# Patient Record
Sex: Male | Born: 1965 | Race: White | Hispanic: No | Marital: Single | Smoking: Never smoker
Health system: Southern US, Community
[De-identification: ages and names within clinical notes are randomized; demographics above are authoritative.]

## PROBLEM LIST (undated history)

## (undated) DIAGNOSIS — K219 Gastro-esophageal reflux disease without esophagitis: Secondary | ICD-10-CM

## (undated) DIAGNOSIS — I10 Essential (primary) hypertension: Secondary | ICD-10-CM

## (undated) DIAGNOSIS — M1990 Unspecified osteoarthritis, unspecified site: Secondary | ICD-10-CM

## (undated) DIAGNOSIS — S299XXA Unspecified injury of thorax, initial encounter: Secondary | ICD-10-CM

## (undated) DIAGNOSIS — N529 Male erectile dysfunction, unspecified: Secondary | ICD-10-CM

## (undated) DIAGNOSIS — T7840XA Allergy, unspecified, initial encounter: Secondary | ICD-10-CM

## (undated) DIAGNOSIS — G40909 Epilepsy, unspecified, not intractable, without status epilepticus: Secondary | ICD-10-CM

## (undated) HISTORY — DX: Gastro-esophageal reflux disease without esophagitis: K21.9

## (undated) HISTORY — DX: Epilepsy, unspecified, not intractable, without status epilepticus: G40.909

## (undated) HISTORY — DX: Unspecified osteoarthritis, unspecified site: M19.90

## (undated) HISTORY — DX: Unspecified injury of thorax, initial encounter: S29.9XXA

## (undated) HISTORY — DX: Essential (primary) hypertension: I10

## (undated) HISTORY — DX: Male erectile dysfunction, unspecified: N52.9

## (undated) HISTORY — DX: Allergy, unspecified, initial encounter: T78.40XA

---
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
## 2007-09-16 ENCOUNTER — Ambulatory Visit (HOSPITAL_COMMUNITY): Admission: RE | Admit: 2007-09-16 | Discharge: 2007-09-16 | Payer: Self-pay | Admitting: Chiropractic Medicine

## 2014-01-22 LAB — PSA

## 2015-02-11 ENCOUNTER — Telehealth: Payer: Self-pay

## 2015-02-11 MED ORDER — TADALAFIL 20 MG PO TABS: 20.0000 mg | ORAL_TABLET | Freq: Every day | ORAL | Status: DC | PRN

## 2015-02-11 MED ORDER — PHENOBARBITAL 97.2 MG PO TABS: 97.2000 mg | ORAL_TABLET | Freq: Every day | ORAL | Status: DC

## 2015-02-11 NOTE — Telephone Encounter (Signed)
Patient was last seen on 08/12/14, practice partner number is [phone] and [facility] [address] in [place].

## 2015-02-11 NOTE — Telephone Encounter (Signed)
Needs seen further refills 

## 2015-02-14 ENCOUNTER — Telehealth: Payer: Self-pay

## 2015-02-14 NOTE — Telephone Encounter (Signed)
Got phone number from practice partner. Called and let patient know that prescriptions were refilled, one was sent to pharmacy and the other needs to be picked up here at the front desk and taken to the pharmacy.

## 2015-02-16 NOTE — Telephone Encounter (Signed)
Telephone number has been disconnected. Unable to contact patient. Thanks.

## 2015-03-30 ENCOUNTER — Other Ambulatory Visit: Payer: Self-pay

## 2015-03-30 NOTE — Telephone Encounter (Signed)
Looked in patient's chart and noticed that Malachy Mood stated this patient needed an appointment for further refills. So I called and scheduled the patient an appointment for 04/01/15. [facility] [address] in [place].

## 2015-03-31 DIAGNOSIS — G40909 Epilepsy, unspecified, not intractable, without status epilepticus: Secondary | ICD-10-CM | POA: Insufficient documentation

## 2015-03-31 DIAGNOSIS — J309 Allergic rhinitis, unspecified: Secondary | ICD-10-CM | POA: Insufficient documentation

## 2015-03-31 DIAGNOSIS — N529 Male erectile dysfunction, unspecified: Secondary | ICD-10-CM | POA: Insufficient documentation

## 2015-04-01 ENCOUNTER — Encounter: Payer: Self-pay | Admitting: Unknown Physician Specialty

## 2015-04-01 ENCOUNTER — Ambulatory Visit (INDEPENDENT_AMBULATORY_CARE_PROVIDER_SITE_OTHER): Payer: BLUE CROSS/BLUE SHIELD | Admitting: Unknown Physician Specialty

## 2015-04-01 VITALS — BP 138/82 | HR 74 | Temp 98.7°F | Ht 68.8 in | Wt 183.6 lb

## 2015-04-01 DIAGNOSIS — Z5181 Encounter for therapeutic drug level monitoring: Secondary | ICD-10-CM | POA: Diagnosis not present

## 2015-04-01 DIAGNOSIS — N529 Male erectile dysfunction, unspecified: Secondary | ICD-10-CM

## 2015-04-01 DIAGNOSIS — G40909 Epilepsy, unspecified, not intractable, without status epilepticus: Secondary | ICD-10-CM

## 2015-04-01 MED ORDER — PHENOBARBITAL 97.2 MG PO TABS: 97.2000 mg | ORAL_TABLET | Freq: Every day | ORAL | Status: DC

## 2015-04-01 MED ORDER — TADALAFIL 20 MG PO TABS: 20.0000 mg | ORAL_TABLET | Freq: Every day | ORAL | Status: DC | PRN

## 2015-04-01 NOTE — Assessment & Plan Note (Signed)
Stable, no seizure activity for over 20 years.

## 2015-04-01 NOTE — Assessment & Plan Note (Signed)
Currently taking tadalafil as needed

## 2015-04-01 NOTE — Progress Notes (Signed)
BP 138/82 mmHg  Pulse 74  Temp(Src) 98.7 F (37.1 C)  Ht 5' 8.8" (1.748 m)  Wt 183 lb 9.6 oz (83.28 kg)  BMI 27.26 kg/m2  SpO2 93%   Subjective:    Patient ID: Dillon Blake, male    DOB: [birth year], 49 y.o.   MRN: [id number]  HPI: Dillon Blake is a 49 y.o. male  Chief Complaint  Patient presents with  . Medication Refill    pt states he needs a refill on cialis and is not completely out of phenobarbital but would like to go ahead and get it refilled because of travel   Seizure disorder: Doing well, no seizures since 1991. Stable on phenobarbitol. Takes medication everyday, no missed doses. Denies nausea, tiredness, irritability.   Erectile Dysfunction: Tadalafil works well. Denies memory issues, diarrhea or cold like symptoms.  Relevant past medical, surgical, family and social history reviewed and updated as indicated. Interim medical history since our last visit reviewed. Allergies and medications reviewed and updated.  Review of Systems  Constitutional: Negative.  Negative for fever, activity change, appetite change and fatigue.  HENT: Negative.  Negative for congestion, postnasal drip and rhinorrhea.   Eyes: Negative.  Negative for discharge and redness.  Respiratory: Negative.  Negative for cough, chest tightness, shortness of breath and wheezing.   Cardiovascular: Negative.  Negative for chest pain, palpitations and leg swelling.  Gastrointestinal: Negative.  Negative for nausea, abdominal pain, diarrhea and constipation.  Musculoskeletal: Negative.  Negative for myalgias, back pain, arthralgias, gait problem and neck pain.  Skin: Negative.  Negative for color change, pallor, rash and wound.  Neurological: Negative.  Negative for dizziness, weakness, light-headedness and headaches.  Psychiatric/Behavioral: Negative.  Negative for behavioral problems, sleep disturbance, self-injury and decreased concentration. The patient is not nervous/anxious.     Per HPI unless  specifically indicated above     Objective:    BP 138/82 mmHg  Pulse 74  Temp(Src) 98.7 F (37.1 C)  Ht 5' 8.8" (1.748 m)  Wt 183 lb 9.6 oz (83.28 kg)  BMI 27.26 kg/m2  SpO2 93%  Wt Readings from Last 3 Encounters:  04/01/15 183 lb 9.6 oz (83.28 kg)  08/12/14 186 lb (84.369 kg)    Physical Exam  Constitutional: He is oriented to person, place, and time. He appears well-developed and well-nourished. No distress.  HENT:  Head: Normocephalic and atraumatic.  Eyes: Conjunctivae are normal. Right eye exhibits no discharge. Left eye exhibits no discharge.  Neck: Normal range of motion.  Cardiovascular: Normal rate, regular rhythm and normal heart sounds.   Pulmonary/Chest: Effort normal and breath sounds normal. No respiratory distress. He has no wheezes. He has no rales. He exhibits no tenderness.  Musculoskeletal: Normal range of motion. He exhibits no edema or tenderness.  Neurological: He is alert and oriented to person, place, and time.  Skin: Skin is warm and dry. No rash noted. He is not diaphoretic. No erythema. No pallor.  Psychiatric: He has a normal mood and affect. His behavior is normal. Judgment and thought content normal.        Assessment & Plan:   Problem List Items Addressed This Visit      Unprioritized   Seizure disorder ([place]) - Primary    Stable, no seizure activity for over 20 years.       Relevant Medications   PHENobarbital (LUMINAL) 97.2 MG tablet   ED (erectile dysfunction)    Currently taking tadalafil as needed  Relevant Medications   tadalafil (CIALIS) 20 MG tablet    Other Visit Diagnoses    Medication monitoring encounter        Relevant Orders    Phenobarbital level    CBC with Differential/Platelet        Follow up plan: Return in about 3 months (around 07/02/2015) for Physical.

## 2015-04-02 LAB — CBC WITH DIFFERENTIAL/PLATELET
BASOS ABS: 0 10*3/uL (ref 0.0–0.2)
Basos: 1 %
EOS (ABSOLUTE): 0.1 10*3/uL (ref 0.0–0.4)
Eos: 1 %
Hematocrit: 47.2 % (ref 37.5–51.0)
Hemoglobin: 16.4 g/dL (ref 12.6–17.7)
IMMATURE GRANS (ABS): 0 10*3/uL (ref 0.0–0.1)
IMMATURE GRANULOCYTES: 0 %
LYMPHS: 37 %
Lymphocytes Absolute: 2.3 10*3/uL (ref 0.7–3.1)
MCH: 32 pg (ref 26.6–33.0)
MCHC: 34.7 g/dL (ref 31.5–35.7)
MCV: 92 fL (ref 79–97)
MONOS ABS: 0.5 10*3/uL (ref 0.1–0.9)
Monocytes: 8 %
NEUTROS PCT: 53 %
Neutrophils Absolute: 3.3 10*3/uL (ref 1.4–7.0)
PLATELETS: 227 10*3/uL (ref 150–379)
RBC: 5.12 x10E6/uL (ref 4.14–5.80)
RDW: 13.2 % (ref 12.3–15.4)
WBC: 6.2 10*3/uL (ref 3.4–10.8)

## 2015-04-02 LAB — PHENOBARBITAL LEVEL: PHENOBARBITAL, SERUM: 15 ug/mL (ref 15–40)

## 2015-04-04 ENCOUNTER — Encounter: Payer: Self-pay | Admitting: Unknown Physician Specialty

## 2015-04-04 NOTE — Progress Notes (Signed)
Quick Note:  Normal labs. Patient notified by letter. ______ 

## 2015-06-08 ENCOUNTER — Other Ambulatory Visit: Payer: Self-pay | Admitting: Unknown Physician Specialty

## 2015-06-08 DIAGNOSIS — N529 Male erectile dysfunction, unspecified: Secondary | ICD-10-CM

## 2015-06-08 NOTE — Telephone Encounter (Signed)
Patient has appointment scheduled for 07/06/15.

## 2015-06-08 NOTE — Telephone Encounter (Signed)
Pt called stated he needs a refill on Cialis. [facility] [facility]. Thanks.

## 2015-06-09 MED ORDER — TADALAFIL 20 MG PO TABS: 20.0000 mg | ORAL_TABLET | Freq: Every day | ORAL | Status: DC | PRN

## 2015-07-06 ENCOUNTER — Encounter: Payer: BLUE CROSS/BLUE SHIELD | Admitting: Unknown Physician Specialty

## 2015-07-19 ENCOUNTER — Encounter: Payer: Self-pay | Admitting: Unknown Physician Specialty

## 2015-08-09 ENCOUNTER — Other Ambulatory Visit: Payer: Self-pay | Admitting: Unknown Physician Specialty

## 2015-09-01 ENCOUNTER — Other Ambulatory Visit: Payer: Self-pay | Admitting: Unknown Physician Specialty

## 2015-09-01 DIAGNOSIS — G40909 Epilepsy, unspecified, not intractable, without status epilepticus: Secondary | ICD-10-CM

## 2015-09-01 NOTE — Telephone Encounter (Signed)
Pt called a refill on Luminal. Please call pt when medication is ready for pick up. Pt would like to pick this up tomorrow as he lives out of town now. Thanks.

## 2015-09-02 MED ORDER — PHENOBARBITAL 97.2 MG PO TABS: 97.2000 mg | ORAL_TABLET | Freq: Every day | ORAL | Status: DC

## 2015-09-02 NOTE — Telephone Encounter (Signed)
Routing to provider  

## 2015-09-20 ENCOUNTER — Other Ambulatory Visit: Payer: Self-pay | Admitting: Unknown Physician Specialty

## 2015-09-20 NOTE — Telephone Encounter (Signed)
rx

## 2015-10-31 ENCOUNTER — Other Ambulatory Visit: Payer: Self-pay | Admitting: Unknown Physician Specialty

## 2015-12-05 ENCOUNTER — Other Ambulatory Visit: Payer: Self-pay | Admitting: Unknown Physician Specialty

## 2015-12-23 ENCOUNTER — Other Ambulatory Visit: Payer: Self-pay | Admitting: Unknown Physician Specialty

## 2016-02-22 ENCOUNTER — Other Ambulatory Visit: Payer: Self-pay | Admitting: Unknown Physician Specialty

## 2016-02-28 ENCOUNTER — Other Ambulatory Visit: Payer: Self-pay | Admitting: Unknown Physician Specialty

## 2016-03-01 ENCOUNTER — Other Ambulatory Visit: Payer: Self-pay | Admitting: Unknown Physician Specialty

## 2016-03-05 ENCOUNTER — Other Ambulatory Visit: Payer: Self-pay | Admitting: Unknown Physician Specialty

## 2016-03-12 ENCOUNTER — Ambulatory Visit: Payer: BLUE CROSS/BLUE SHIELD | Admitting: Unknown Physician Specialty

## 2016-05-09 ENCOUNTER — Ambulatory Visit (INDEPENDENT_AMBULATORY_CARE_PROVIDER_SITE_OTHER): Payer: BLUE CROSS/BLUE SHIELD | Admitting: Unknown Physician Specialty

## 2016-05-09 ENCOUNTER — Encounter: Payer: Self-pay | Admitting: Unknown Physician Specialty

## 2016-05-09 VITALS — BP 153/96 | HR 67 | Temp 98.3°F | Ht 70.0 in | Wt 194.2 lb

## 2016-05-09 DIAGNOSIS — Z5181 Encounter for therapeutic drug level monitoring: Secondary | ICD-10-CM | POA: Diagnosis not present

## 2016-05-09 DIAGNOSIS — I10 Essential (primary) hypertension: Secondary | ICD-10-CM | POA: Insufficient documentation

## 2016-05-09 DIAGNOSIS — G40909 Epilepsy, unspecified, not intractable, without status epilepticus: Secondary | ICD-10-CM | POA: Diagnosis not present

## 2016-05-09 DIAGNOSIS — N529 Male erectile dysfunction, unspecified: Secondary | ICD-10-CM

## 2016-05-09 MED ORDER — PHENOBARBITAL 97.2 MG PO TABS: 97.2000 mg | ORAL_TABLET | Freq: Every day | ORAL | 5 refills | Status: DC

## 2016-05-09 MED ORDER — TADALAFIL 20 MG PO TABS: 20.0000 mg | ORAL_TABLET | Freq: Every day | ORAL | 12 refills | Status: DC | PRN

## 2016-05-09 NOTE — Assessment & Plan Note (Signed)
Refill current medication

## 2016-05-09 NOTE — Patient Instructions (Addendum)
DASH Eating Plan DASH stands for "Dietary Approaches to Stop Hypertension." The DASH eating plan is a healthy eating plan that has been shown to reduce high blood pressure (hypertension). Additional health benefits may include reducing the risk of type 2 diabetes mellitus, heart disease, and stroke. The DASH eating plan may also help with weight loss. What do I need to know about the DASH eating plan? For the DASH eating plan, you will follow these general guidelines:  Choose foods with less than 150 milligrams of sodium per serving (as listed on the food label).  Use salt-free seasonings or herbs instead of table salt or sea salt.  Check with your health care provider or pharmacist before using salt substitutes.  Eat lower-sodium products. These are often labeled as "low-sodium" or "no salt added."  Eat fresh foods. Avoid eating a lot of canned foods.  Eat more vegetables, fruits, and low-fat dairy products.  Choose whole grains. Look for the word "whole" as the first word in the ingredient list.  Choose fish and skinless chicken or turkey more often than red meat. Limit fish, poultry, and meat to 6 oz (170 g) each day.  Limit sweets, desserts, sugars, and sugary drinks.  Choose heart-healthy fats.  Eat more home-cooked food and less restaurant, buffet, and fast food.  Limit fried foods.  Do not fry foods. Cook foods using methods such as baking, boiling, grilling, and broiling instead.  When eating at a restaurant, ask that your food be prepared with less salt, or no salt if possible. What foods can I eat? Seek help from a dietitian for individual calorie needs. Grains  Whole grain or whole wheat bread. Brown rice. Whole grain or whole wheat pasta. Quinoa, bulgur, and whole grain cereals. Low-sodium cereals. Corn or whole wheat flour tortillas. Whole grain cornbread. Whole grain crackers. Low-sodium crackers. Vegetables  Fresh or frozen vegetables (raw, steamed, roasted, or  grilled). Low-sodium or reduced-sodium tomato and vegetable juices. Low-sodium or reduced-sodium tomato sauce and paste. Low-sodium or reduced-sodium canned vegetables. Fruits  All fresh, canned (in natural juice), or frozen fruits. Meat and Other Protein Products  Ground beef (85% or leaner), grass-fed beef, or beef trimmed of fat. Skinless chicken or turkey. Ground chicken or turkey. Pork trimmed of fat. All fish and seafood. Eggs. Dried beans, peas, or lentils. Unsalted nuts and seeds. Unsalted canned beans. Dairy  Low-fat dairy products, such as skim or 1% milk, 2% or reduced-fat cheeses, low-fat ricotta or cottage cheese, or plain low-fat yogurt. Low-sodium or reduced-sodium cheeses. Fats and Oils  Tub margarines without trans fats. Light or reduced-fat mayonnaise and salad dressings (reduced sodium). Avocado. Safflower, olive, or canola oils. Natural peanut or almond butter. Other  Unsalted popcorn and pretzels. The items listed above may not be a complete list of recommended foods or beverages. Contact your dietitian for more options.  What foods are not recommended? Grains  White bread. White pasta. White rice. Refined cornbread. Bagels and croissants. Crackers that contain trans fat. Vegetables  Creamed or fried vegetables. Vegetables in a cheese sauce. Regular canned vegetables. Regular canned tomato sauce and paste. Regular tomato and vegetable juices. Fruits  Canned fruit in light or heavy syrup. Fruit juice. Meat and Other Protein Products  Fatty cuts of meat. Ribs, chicken wings, bacon, sausage, bologna, salami, chitterlings, fatback, hot dogs, bratwurst, and packaged luncheon meats. Salted nuts and seeds. Canned beans with salt. Dairy  Whole or 2% milk, cream, half-and-half, and cream cheese. Whole-fat or sweetened yogurt. Full-fat cheeses   or blue cheese. Nondairy creamers and whipped toppings. Processed cheese, cheese spreads, or cheese curds. Condiments  Onion and garlic  salt, seasoned salt, table salt, and sea salt. Canned and packaged gravies. Worcestershire sauce. Tartar sauce. Barbecue sauce. Teriyaki sauce. Soy sauce, including reduced sodium. Steak sauce. Fish sauce. Oyster sauce. Cocktail sauce. Horseradish. Ketchup and mustard. Meat flavorings and tenderizers. Bouillon cubes. Hot sauce. Tabasco sauce. Marinades. Taco seasonings. Relishes. Fats and Oils  Butter, stick margarine, lard, shortening, ghee, and bacon fat. Coconut, palm kernel, or palm oils. Regular salad dressings. Other  Pickles and olives. Salted popcorn and pretzels. The items listed above may not be a complete list of foods and beverages to avoid. Contact your dietitian for more information.  Where can I find more information? National Heart, Lung, and Blood Institute: www.nhlbi.nih.gov/health/health-topics/topics/dash/ This information is not intended to replace advice given to you by your health care provider. Make sure you discuss any questions you have with your health care provider. Document Released: 05/31/2011 Document Revised: 11/17/2015 Document Reviewed: 04/15/2013 Elsevier Interactive Patient Education  2017 Elsevier Inc.  

## 2016-05-09 NOTE — Progress Notes (Signed)
BP (!) 153/96 (BP Location: Left Arm, Cuff Size: Large)   Pulse 67   Temp 98.3 F (36.8 C)   Ht 5\' 10"  (1.778 m)   Wt 194 lb 3.2 oz (88.1 kg)   SpO2 98%   BMI 27.86 kg/m    Subjective:    Patient ID: Dillon Blake, male    DOB: [birth year], 50 y.o.   MRN: [id number]  HPI: Dillon Blake is a 50 y.o. male  Chief Complaint  Patient presents with  . Seizures  . Erectile Dysfunction  . Medication Refill    pt states he needs both medications refilled   Seizure disorder: Doing well, no seizures since 1991. Stable on phenobarbitol. Takes medication everyday, no missed doses. Denies nausea, tiredness, irritability.  Erectile Dysfunction: Tadalafil works well. Denies memory issues, diarrhea or cold like symptoms.  Hypertension Gained some weight in the past year.  He does not want to start on medication at this time and wants to work on life-style   Relevant past medical, surgical, family and social history reviewed and updated as indicated. Interim medical history since our last visit reviewed. Allergies and medications reviewed and updated.  Review of Systems  Per HPI unless specifically indicated above     Objective:    BP (!) 153/96 (BP Location: Left Arm, Cuff Size: Large)   Pulse 67   Temp 98.3 F (36.8 C)   Ht 5\' 10"  (1.778 m)   Wt 194 lb 3.2 oz (88.1 kg)   SpO2 98%   BMI 27.86 kg/m   Wt Readings from Last 3 Encounters:  05/09/16 194 lb 3.2 oz (88.1 kg)  04/01/15 183 lb 9.6 oz (83.3 kg)  08/12/14 186 lb (84.4 kg)    Physical Exam  Constitutional: He is oriented to person, place, and time. He appears well-developed and well-nourished. No distress.  HENT:  Head: Normocephalic and atraumatic.  Eyes: Conjunctivae and lids are normal. Right eye exhibits no discharge. Left eye exhibits no discharge. No scleral icterus.  Neck: Normal range of motion. Neck supple. No JVD present. Carotid bruit is not present.  Cardiovascular: Normal rate, regular rhythm and  normal heart sounds.   Pulmonary/Chest: Effort normal and breath sounds normal. No respiratory distress.  Abdominal: Normal appearance. There is no splenomegaly or hepatomegaly.  Musculoskeletal: Normal range of motion.  Neurological: He is alert and oriented to person, place, and time.  Skin: Skin is warm, dry and intact. No rash noted. No pallor.  Psychiatric: He has a normal mood and affect. His behavior is normal. Judgment and thought content normal.    Results for orders placed or performed in visit on 04/01/15  Phenobarbital level  Result Value Ref Range   Phenobarbital, Serum 15 15 - 40 ug/mL  CBC with Differential/Platelet  Result Value Ref Range   WBC 6.2 3.4 - 10.8 x10E3/uL   RBC 5.12 4.14 - 5.80 x10E6/uL   Hemoglobin 16.4 12.6 - 17.7 g/dL   Hematocrit 47.2 37.5 - 51.0 %   MCV 92 79 - 97 fL   MCH 32.0 26.6 - 33.0 pg   MCHC 34.7 31.5 - 35.7 g/dL   RDW 13.2 12.3 - 15.4 %   Platelets 227 150 - 379 x10E3/uL   Neutrophils 53 %   Lymphs 37 %   Monocytes 8 %   Eos 1 %   Basos 1 %   Neutrophils Absolute 3.3 1.4 - 7.0 x10E3/uL   Lymphocytes Absolute 2.3 0.7 - 3.1 x10E3/uL   Monocytes Absolute  0.5 0.1 - 0.9 x10E3/uL   EOS (ABSOLUTE) 0.1 0.0 - 0.4 x10E3/uL   Basophils Absolute 0.0 0.0 - 0.2 x10E3/uL   Immature Granulocytes 0 %   Immature Grans (Abs) 0.0 0.0 - 0.1 x10E3/uL      Assessment & Plan:   Problem List Items Addressed This Visit      Unprioritized   ED (erectile dysfunction)    Refill current medication      Hypertension    Discussed high blood pressure.  Pt wants to work on life-style.  Dash diet given      Relevant Medications   tadalafil (CIALIS) 20 MG tablet   Seizure disorder ([place]) - Primary    Refill phenobarb.  Labs for medication monitoring       Other Visit Diagnoses    Medication monitoring encounter       Relevant Orders   Phenobarbital level   CBC with Differential/Platelet   Comprehensive metabolic panel       Follow up  plan: Return in about 6 months (around 11/06/2016) for physical.

## 2016-05-09 NOTE — Assessment & Plan Note (Signed)
Refill phenobarb.  Labs for medication monitoring

## 2016-05-09 NOTE — Assessment & Plan Note (Signed)
Discussed high blood pressure.  Pt wants to work on life-style.  Dash diet given

## 2016-05-10 ENCOUNTER — Encounter: Payer: Self-pay | Admitting: Unknown Physician Specialty

## 2016-05-10 LAB — COMPREHENSIVE METABOLIC PANEL
A/G RATIO: 2 (ref 1.2–2.2)
ALBUMIN: 5 g/dL (ref 3.5–5.5)
ALT: 42 IU/L (ref 0–44)
AST: 37 IU/L (ref 0–40)
Alkaline Phosphatase: 72 IU/L (ref 39–117)
BUN / CREAT RATIO: 18 (ref 9–20)
BUN: 18 mg/dL (ref 6–24)
Bilirubin Total: 0.4 mg/dL (ref 0.0–1.2)
CALCIUM: 9.4 mg/dL (ref 8.7–10.2)
CO2: 25 mmol/L (ref 18–29)
Chloride: 96 mmol/L (ref 96–106)
Creatinine, Ser: 1.02 mg/dL (ref 0.76–1.27)
GFR, EST AFRICAN AMERICAN: 100 mL/min/{1.73_m2} (ref >59)
GFR, EST NON AFRICAN AMERICAN: 87 mL/min/{1.73_m2} (ref >59)
GLOBULIN, TOTAL: 2.5 g/dL (ref 1.5–4.5)
Glucose: 78 mg/dL (ref 65–99)
POTASSIUM: 4.5 mmol/L (ref 3.5–5.2)
SODIUM: 140 mmol/L (ref 134–144)
TOTAL PROTEIN: 7.5 g/dL (ref 6.0–8.5)

## 2016-05-10 LAB — CBC WITH DIFFERENTIAL/PLATELET
BASOS: 1 %
Basophils Absolute: 0 10*3/uL (ref 0.0–0.2)
EOS (ABSOLUTE): 0.1 10*3/uL (ref 0.0–0.4)
EOS: 1 %
HEMATOCRIT: 48.5 % (ref 37.5–51.0)
HEMOGLOBIN: 16.9 g/dL (ref 12.6–17.7)
IMMATURE GRANULOCYTES: 0 %
Immature Grans (Abs): 0 10*3/uL (ref 0.0–0.1)
Lymphocytes Absolute: 2.5 10*3/uL (ref 0.7–3.1)
Lymphs: 34 %
MCH: 31.6 pg (ref 26.6–33.0)
MCHC: 34.8 g/dL (ref 31.5–35.7)
MCV: 91 fL (ref 79–97)
MONOS ABS: 0.6 10*3/uL (ref 0.1–0.9)
Monocytes: 8 %
NEUTROS PCT: 56 %
Neutrophils Absolute: 4.1 10*3/uL (ref 1.4–7.0)
Platelets: 261 10*3/uL (ref 150–379)
RBC: 5.35 x10E6/uL (ref 4.14–5.80)
RDW: 13.8 % (ref 12.3–15.4)
WBC: 7.3 10*3/uL (ref 3.4–10.8)

## 2016-05-10 LAB — PHENOBARBITAL LEVEL: Phenobarbital, Serum: 14 ug/mL — ABNORMAL LOW (ref 15–40)

## 2016-08-06 DIAGNOSIS — H0011 Chalazion right upper eyelid: Secondary | ICD-10-CM | POA: Diagnosis not present

## 2016-08-06 DIAGNOSIS — H0014 Chalazion left upper eyelid: Secondary | ICD-10-CM | POA: Diagnosis not present

## 2016-11-09 ENCOUNTER — Encounter: Payer: BLUE CROSS/BLUE SHIELD | Admitting: Unknown Physician Specialty

## 2016-11-21 ENCOUNTER — Other Ambulatory Visit: Payer: Self-pay | Admitting: Unknown Physician Specialty

## 2016-11-22 ENCOUNTER — Telehealth: Payer: Self-pay | Admitting: Unknown Physician Specialty

## 2016-11-22 NOTE — Telephone Encounter (Signed)
Patient wanted to see if his medication for phenobarbitol had been refilled. Patient stated that his pharmacy sent a fax but he did not know for sure if it was sent so he wanted to make sure to inform the office that he needed a refill.   Please Advise.  Thank you

## 2016-11-23 NOTE — Telephone Encounter (Signed)
Prescription refilled, signed by Malachy Mood and faxed to the pharmacy. Will call patient shortly and let him know.

## 2016-11-23 NOTE — Telephone Encounter (Signed)
Called and left patient a VM (not detailed) letting him know that his prescription has been faxed in for him.

## 2016-11-26 ENCOUNTER — Encounter: Payer: Self-pay | Admitting: Unknown Physician Specialty

## 2016-12-17 ENCOUNTER — Encounter: Payer: BLUE CROSS/BLUE SHIELD | Admitting: Unknown Physician Specialty

## 2017-01-15 ENCOUNTER — Encounter: Payer: BLUE CROSS/BLUE SHIELD | Admitting: Unknown Physician Specialty

## 2017-02-04 ENCOUNTER — Encounter: Payer: BLUE CROSS/BLUE SHIELD | Admitting: Unknown Physician Specialty

## 2017-05-14 ENCOUNTER — Other Ambulatory Visit: Payer: Self-pay | Admitting: Unknown Physician Specialty

## 2017-05-14 ENCOUNTER — Telehealth: Payer: Self-pay | Admitting: Unknown Physician Specialty

## 2017-05-14 NOTE — Telephone Encounter (Signed)
Copied from [place] [id number]. Topic: Inquiry >> May 14, 2017  7:05 PM Dillon Blake, NT wrote: Reason for CRM: Patient wants a refill cialis and [facility]  [facility] [address] [facility] , has expired  please call [phone]

## 2017-05-15 MED ORDER — TADALAFIL 20 MG PO TABS: 20.0000 mg | ORAL_TABLET | Freq: Every day | ORAL | 0 refills | Status: DC | PRN

## 2017-05-15 NOTE — Telephone Encounter (Signed)
Pt needs appt.  It has been over a year since seen

## 2017-05-15 NOTE — Telephone Encounter (Signed)
Pt called made apt for next week for med management.  He would 2 cialis  for this weekend if possible, he would like to pick up today or Friday.

## 2017-05-22 ENCOUNTER — Encounter: Payer: Self-pay | Admitting: Unknown Physician Specialty

## 2017-05-22 ENCOUNTER — Ambulatory Visit: Payer: BLUE CROSS/BLUE SHIELD | Admitting: Unknown Physician Specialty

## 2017-05-22 DIAGNOSIS — N529 Male erectile dysfunction, unspecified: Secondary | ICD-10-CM | POA: Diagnosis not present

## 2017-05-22 DIAGNOSIS — Z5181 Encounter for therapeutic drug level monitoring: Secondary | ICD-10-CM

## 2017-05-22 DIAGNOSIS — G40909 Epilepsy, unspecified, not intractable, without status epilepticus: Secondary | ICD-10-CM

## 2017-05-22 DIAGNOSIS — I10 Essential (primary) hypertension: Secondary | ICD-10-CM | POA: Diagnosis not present

## 2017-05-22 MED ORDER — PHENOBARBITAL 97.2 MG PO TABS: 97.2000 mg | ORAL_TABLET | Freq: Every day | ORAL | 5 refills | Status: DC

## 2017-05-22 MED ORDER — TADALAFIL 20 MG PO TABS: 20.0000 mg | ORAL_TABLET | Freq: Every day | ORAL | 0 refills | Status: DC | PRN

## 2017-05-22 NOTE — Assessment & Plan Note (Signed)
Stable, continue present medications.   

## 2017-05-22 NOTE — Progress Notes (Signed)
BP 139/83   Pulse 76   Temp 98.7 F (37.1 C) (Oral)   Ht 5' 9.5" (1.765 m)   Wt 193 lb 1.6 oz (87.6 kg)   SpO2 96%   BMI 28.11 kg/m    Subjective:    Patient ID: Dillon Blake, male    DOB: [birth year], 51 y.o.   MRN: [id number]  HPI: Dillon Blake is a 51 y.o. male  Chief Complaint  Patient presents with  . Seizures  . Erectile Dysfunction   Seizure disorder: Doing well on the same dose of Phenobarbital, no seizures since 1991. Never misses doses. Denies side effects.  Dentist is concerned about teeth and comes  Erectile Dysfunction:Cialis works well. Denies headaches  Hypertension Improved today. No medications.  Just returned from [place] with a better diet and starting to exercise with a treadmill and bike.   Relevant past medical, surgical, family and social history reviewed and updated as indicated. Interim medical history since our last visit reviewed. Allergies and medications reviewed and updated.  Review of Systems  Constitutional: Negative.   HENT: Negative.   Respiratory: Negative.   Cardiovascular: Negative.   Gastrointestinal: Negative.   Genitourinary: Negative.   Psychiatric/Behavioral: Negative.     Per HPI unless specifically indicated above     Objective:    BP 139/83   Pulse 76   Temp 98.7 F (37.1 C) (Oral)   Ht 5' 9.5" (1.765 m)   Wt 193 lb 1.6 oz (87.6 kg)   SpO2 96%   BMI 28.11 kg/m   Wt Readings from Last 3 Encounters:  05/22/17 193 lb 1.6 oz (87.6 kg)  05/09/16 194 lb 3.2 oz (88.1 kg)  04/01/15 183 lb 9.6 oz (83.3 kg)    Physical Exam  Constitutional: He is oriented to person, place, and time. He appears well-developed and well-nourished. No distress.  HENT:  Head: Normocephalic and atraumatic.  Eyes: Conjunctivae and lids are normal. Right eye exhibits no discharge. Left eye exhibits no discharge. No scleral icterus.  Neck: Normal range of motion. Neck supple. No JVD present. Carotid bruit is not present.    Cardiovascular: Normal rate, regular rhythm and normal heart sounds.  Pulmonary/Chest: Effort normal and breath sounds normal. No respiratory distress.  Abdominal: Normal appearance. There is no splenomegaly or hepatomegaly.  Musculoskeletal: Normal range of motion.  Neurological: He is alert and oriented to person, place, and time.  Skin: Skin is warm, dry and intact. No rash noted. No pallor.  Psychiatric: He has a normal mood and affect. His behavior is normal. Judgment and thought content normal.    Results for orders placed or performed in visit on 05/09/16  Phenobarbital level  Result Value Ref Range   Phenobarbital, Serum 14 (L) 15 - 40 ug/mL  CBC with Differential/Platelet  Result Value Ref Range   WBC 7.3 3.4 - 10.8 x10E3/uL   RBC 5.35 4.14 - 5.80 x10E6/uL   Hemoglobin 16.9 12.6 - 17.7 g/dL   Hematocrit 48.5 37.5 - 51.0 %   MCV 91 79 - 97 fL   MCH 31.6 26.6 - 33.0 pg   MCHC 34.8 31.5 - 35.7 g/dL   RDW 13.8 12.3 - 15.4 %   Platelets 261 150 - 379 x10E3/uL   Neutrophils 56 Not Estab. %   Lymphs 34 Not Estab. %   Monocytes 8 Not Estab. %   Eos 1 Not Estab. %   Basos 1 Not Estab. %   Neutrophils Absolute 4.1 1.4 - 7.0 x10E3/uL  Lymphocytes Absolute 2.5 0.7 - 3.1 x10E3/uL   Monocytes Absolute 0.6 0.1 - 0.9 x10E3/uL   EOS (ABSOLUTE) 0.1 0.0 - 0.4 x10E3/uL   Basophils Absolute 0.0 0.0 - 0.2 x10E3/uL   Immature Granulocytes 0 Not Estab. %   Immature Grans (Abs) 0.0 0.0 - 0.1 x10E3/uL  Comprehensive metabolic panel  Result Value Ref Range   Glucose 78 65 - 99 mg/dL   BUN 18 6 - 24 mg/dL   Creatinine, Ser 1.02 0.76 - 1.27 mg/dL   GFR calc non Af Amer 87 >59 mL/min/1.73   GFR calc Af Amer 100 >59 mL/min/1.73   BUN/Creatinine Ratio 18 9 - 20   Sodium 140 134 - 144 mmol/L   Potassium 4.5 3.5 - 5.2 mmol/L   Chloride 96 96 - 106 mmol/L   CO2 25 18 - 29 mmol/L   Calcium 9.4 8.7 - 10.2 mg/dL   Total Protein 7.5 6.0 - 8.5 g/dL   Albumin 5.0 3.5 - 5.5 g/dL   Globulin,  Total 2.5 1.5 - 4.5 g/dL   Albumin/Globulin Ratio 2.0 1.2 - 2.2   Bilirubin Total 0.4 0.0 - 1.2 mg/dL   Alkaline Phosphatase 72 39 - 117 IU/L   AST 37 0 - 40 IU/L   ALT 42 0 - 44 IU/L      Assessment & Plan:   Problem List Items Addressed This Visit      Unprioritized   ED (erectile dysfunction)    Stable, continue present medications.        Hypertension    Improved today with lifestyle changes      Relevant Medications   tadalafil (CIALIS) 20 MG tablet   Other Relevant Orders   Comprehensive metabolic panel   Medication monitoring encounter    CBC and CMP is needed.        Relevant Orders   CBC with Differential/Platelet   Comprehensive metabolic panel   Seizure disorder ([facility])    Stable, continue present medications.        Relevant Medications   PHENobarbital (LUMINAL) 97.2 MG tablet   Other Relevant Orders   Phenobarbital level       Follow up plan: Return in about 6 months (around 11/19/2017).

## 2017-05-22 NOTE — Assessment & Plan Note (Signed)
CBC and CMP is needed.

## 2017-05-22 NOTE — Assessment & Plan Note (Signed)
Improved today with lifestyle changes

## 2017-05-23 ENCOUNTER — Encounter: Payer: Self-pay | Admitting: Unknown Physician Specialty

## 2017-05-23 LAB — CBC WITH DIFFERENTIAL/PLATELET
BASOS: 0 %
Basophils Absolute: 0 10*3/uL (ref 0.0–0.2)
EOS (ABSOLUTE): 0.1 10*3/uL (ref 0.0–0.4)
Eos: 1 %
Hematocrit: 47.9 % (ref 37.5–51.0)
Hemoglobin: 16.5 g/dL (ref 13.0–17.7)
IMMATURE GRANS (ABS): 0 10*3/uL (ref 0.0–0.1)
Immature Granulocytes: 0 %
LYMPHS ABS: 2.2 10*3/uL (ref 0.7–3.1)
LYMPHS: 32 %
MCH: 31.7 pg (ref 26.6–33.0)
MCHC: 34.4 g/dL (ref 31.5–35.7)
MCV: 92 fL (ref 79–97)
Monocytes Absolute: 0.4 10*3/uL (ref 0.1–0.9)
Monocytes: 5 %
NEUTROS ABS: 4.2 10*3/uL (ref 1.4–7.0)
Neutrophils: 62 %
PLATELETS: 254 10*3/uL (ref 150–379)
RBC: 5.21 x10E6/uL (ref 4.14–5.80)
RDW: 13.3 % (ref 12.3–15.4)
WBC: 6.8 10*3/uL (ref 3.4–10.8)

## 2017-05-23 LAB — COMPREHENSIVE METABOLIC PANEL
A/G RATIO: 1.8 (ref 1.2–2.2)
ALBUMIN: 4.7 g/dL (ref 3.5–5.5)
ALK PHOS: 80 IU/L (ref 39–117)
ALT: 29 IU/L (ref 0–44)
AST: 27 IU/L (ref 0–40)
BILIRUBIN TOTAL: 0.3 mg/dL (ref 0.0–1.2)
BUN / CREAT RATIO: 10 (ref 9–20)
BUN: 12 mg/dL (ref 6–24)
CHLORIDE: 99 mmol/L (ref 96–106)
CO2: 26 mmol/L (ref 20–29)
Calcium: 9.2 mg/dL (ref 8.7–10.2)
Creatinine, Ser: 1.15 mg/dL (ref 0.76–1.27)
GFR calc non Af Amer: 74 mL/min/{1.73_m2} (ref >59)
GFR, EST AFRICAN AMERICAN: 86 mL/min/{1.73_m2} (ref >59)
Globulin, Total: 2.6 g/dL (ref 1.5–4.5)
Glucose: 95 mg/dL (ref 65–99)
POTASSIUM: 4.6 mmol/L (ref 3.5–5.2)
Sodium: 138 mmol/L (ref 134–144)
TOTAL PROTEIN: 7.3 g/dL (ref 6.0–8.5)

## 2017-05-23 LAB — PHENOBARBITAL LEVEL: PHENOBARBITAL, SERUM: 14 ug/mL — AB (ref 15–40)

## 2017-07-02 DIAGNOSIS — J019 Acute sinusitis, unspecified: Secondary | ICD-10-CM | POA: Diagnosis not present

## 2017-07-02 DIAGNOSIS — J029 Acute pharyngitis, unspecified: Secondary | ICD-10-CM | POA: Diagnosis not present

## 2017-07-02 DIAGNOSIS — H0012 Chalazion right lower eyelid: Secondary | ICD-10-CM | POA: Diagnosis not present

## 2017-07-24 ENCOUNTER — Other Ambulatory Visit: Payer: Self-pay | Admitting: Unknown Physician Specialty

## 2017-10-25 ENCOUNTER — Other Ambulatory Visit: Payer: Self-pay | Admitting: Unknown Physician Specialty

## 2017-11-19 ENCOUNTER — Ambulatory Visit (INDEPENDENT_AMBULATORY_CARE_PROVIDER_SITE_OTHER): Payer: BLUE CROSS/BLUE SHIELD | Admitting: Unknown Physician Specialty

## 2017-11-19 ENCOUNTER — Encounter: Payer: Self-pay | Admitting: Unknown Physician Specialty

## 2017-11-19 VITALS — BP 152/89 | HR 66 | Temp 98.5°F | Ht 69.5 in | Wt 186.2 lb

## 2017-11-19 DIAGNOSIS — M25561 Pain in right knee: Secondary | ICD-10-CM | POA: Diagnosis not present

## 2017-11-19 DIAGNOSIS — Z0000 Encounter for general adult medical examination without abnormal findings: Secondary | ICD-10-CM

## 2017-11-19 DIAGNOSIS — G40909 Epilepsy, unspecified, not intractable, without status epilepticus: Secondary | ICD-10-CM | POA: Diagnosis not present

## 2017-11-19 DIAGNOSIS — Z5181 Encounter for therapeutic drug level monitoring: Secondary | ICD-10-CM | POA: Diagnosis not present

## 2017-11-19 DIAGNOSIS — I10 Essential (primary) hypertension: Secondary | ICD-10-CM | POA: Diagnosis not present

## 2017-11-19 MED ORDER — LISINOPRIL 5 MG PO TABS: 5.0000 mg | ORAL_TABLET | Freq: Every day | ORAL | 1 refills | Status: DC

## 2017-11-19 NOTE — Progress Notes (Signed)
BP (!) 152/89 (BP Location: Left Arm, Cuff Size: Normal)   Pulse 66   Temp 98.5 F (36.9 C) (Oral)   Ht 5' 9.5" (1.765 m)   Wt 186 lb 3.2 oz (84.5 kg)   SpO2 98%   BMI 27.10 kg/m    Subjective:    Patient ID: Dillon Blake, male    DOB: Apr 29, 1966, 52 y.o.   MRN: [id number]  HPI: Dillon Blake is a 52 y.o. male  Chief Complaint  Patient presents with  . Annual Exam  . Knee Pain    pt states he has been having tightness in his right knee since last week. States he has been exercising more lately.    Depression screen [facility] 2/9 11/19/2017 05/22/2017  Decreased Interest 0 0  Down, Depressed, Hopeless 0 0  PHQ - 2 Score 0 0   Knee pain Pt with knee pain since Thursday.  Pt is starting [employer] and wonder if he is overdoing it.    Seizure Last seizure was 1991  Hypertension Using medications without difficulty Average home BPs Not checking   No problems or lightheadedness No chest pain with exertion or shortness of breath No Edema   Relevant past medical, surgical, family and social history reviewed and updated as indicated. Interim medical history since our last visit reviewed. Allergies and medications reviewed and updated.  Review of Systems  Per HPI unless specifically indicated above     Objective:    BP (!) 152/89 (BP Location: Left Arm, Cuff Size: Normal)   Pulse 66   Temp 98.5 F (36.9 C) (Oral)   Ht 5' 9.5" (1.765 m)   Wt 186 lb 3.2 oz (84.5 kg)   SpO2 98%   BMI 27.10 kg/m   Wt Readings from Last 3 Encounters:  11/19/17 186 lb 3.2 oz (84.5 kg)  05/22/17 193 lb 1.6 oz (87.6 kg)  05/09/16 194 lb 3.2 oz (88.1 kg)    Physical Exam  Constitutional: He is oriented to person, place, and time. He appears well-developed and well-nourished.  HENT:  Head: Normocephalic.  Right Ear: Tympanic membrane, external ear and ear canal normal.  Left Ear: Tympanic membrane, external ear and ear canal normal.  Mouth/Throat: Uvula is midline, oropharynx  is clear and moist and mucous membranes are normal.  Eyes: Pupils are equal, round, and reactive to light.  Cardiovascular: Normal rate, regular rhythm and normal heart sounds. Exam reveals no gallop and no friction rub.  No murmur heard. Pulmonary/Chest: Effort normal and breath sounds normal. No respiratory distress.  Abdominal: Soft. Bowel sounds are normal. He exhibits no distension. There is no tenderness.  Musculoskeletal: Normal range of motion.       Right knee: He exhibits normal range of motion, no swelling, no effusion, no ecchymosis and no deformity. No tenderness found.  Neurological: He is alert and oriented to person, place, and time. He has normal reflexes.  Skin: Skin is warm and dry.  Psychiatric: He has a normal mood and affect. His behavior is normal. Judgment and thought content normal.    Results for orders placed or performed in visit on 05/22/17  Phenobarbital level  Result Value Ref Range   Phenobarbital, Serum 14 (L) 15 - 40 ug/mL  CBC with Differential/Platelet  Result Value Ref Range   WBC 6.8 3.4 - 10.8 x10E3/uL   RBC 5.21 4.14 - 5.80 x10E6/uL   Hemoglobin 16.5 13.0 - 17.7 g/dL   Hematocrit 47.9 37.5 - 51.0 %   MCV  92 79 - 97 fL   MCH 31.7 26.6 - 33.0 pg   MCHC 34.4 31.5 - 35.7 g/dL   RDW 13.3 12.3 - 15.4 %   Platelets 254 150 - 379 x10E3/uL   Neutrophils 62 Not Estab. %   Lymphs 32 Not Estab. %   Monocytes 5 Not Estab. %   Eos 1 Not Estab. %   Basos 0 Not Estab. %   Neutrophils Absolute 4.2 1.4 - 7.0 x10E3/uL   Lymphocytes Absolute 2.2 0.7 - 3.1 x10E3/uL   Monocytes Absolute 0.4 0.1 - 0.9 x10E3/uL   EOS (ABSOLUTE) 0.1 0.0 - 0.4 x10E3/uL   Basophils Absolute 0.0 0.0 - 0.2 x10E3/uL   Immature Granulocytes 0 Not Estab. %   Immature Grans (Abs) 0.0 0.0 - 0.1 x10E3/uL  Comprehensive metabolic panel  Result Value Ref Range   Glucose 95 65 - 99 mg/dL   BUN 12 6 - 24 mg/dL   Creatinine, Ser 1.15 0.76 - 1.27 mg/dL   GFR calc non Af Amer 74 >59  mL/min/1.73   GFR calc Af Amer 86 >59 mL/min/1.73   BUN/Creatinine Ratio 10 9 - 20   Sodium 138 134 - 144 mmol/L   Potassium 4.6 3.5 - 5.2 mmol/L   Chloride 99 96 - 106 mmol/L   CO2 26 20 - 29 mmol/L   Calcium 9.2 8.7 - 10.2 mg/dL   Total Protein 7.3 6.0 - 8.5 g/dL   Albumin 4.7 3.5 - 5.5 g/dL   Globulin, Total 2.6 1.5 - 4.5 g/dL   Albumin/Globulin Ratio 1.8 1.2 - 2.2   Bilirubin Total 0.3 0.0 - 1.2 mg/dL   Alkaline Phosphatase 80 39 - 117 IU/L   AST 27 0 - 40 IU/L   ALT 29 0 - 44 IU/L      Assessment & Plan:   Problem List Items Addressed This Visit      Unprioritized   Hypertension    High today for the second time.  Start Lisinopril 5 mg daily.  Pt ed on side effects.        Relevant Medications   lisinopril (PRINIVIL,ZESTRIL) 5 MG tablet   Other Relevant Orders   CBC with Differential/Platelet   Comprehensive metabolic panel   Medication monitoring encounter    Check Phenobarbital level      Relevant Orders   Phenobarbital level   Seizure disorder (HCC)    Check phenobarbital levels.  Continue current dose as no seizures since 1991       Other Visit Diagnoses    Routine general medical examination at a health care facility    -  Primary   Relevant Orders   Lipid Panel w/o Chol/HDL Ratio   TSH   PSA   Ambulatory referral to Gastroenterology   Acute pain of right knee       Suportive care with stretching, NSAIDs, and lighter exercise.         Follow up plan: Return in about 5 weeks (around 12/24/2017).

## 2017-11-19 NOTE — Assessment & Plan Note (Addendum)
High today for the second time.  Start Lisinopril 5 mg daily.  Pt ed on side effects.

## 2017-11-19 NOTE — Assessment & Plan Note (Signed)
Check Phenobarbital level

## 2017-11-19 NOTE — Assessment & Plan Note (Signed)
Check phenobarbital levels.  Continue current dose as no seizures since 1991

## 2017-11-20 ENCOUNTER — Other Ambulatory Visit: Payer: Self-pay | Admitting: Unknown Physician Specialty

## 2017-11-20 ENCOUNTER — Encounter: Payer: Self-pay | Admitting: Unknown Physician Specialty

## 2017-11-20 LAB — COMPREHENSIVE METABOLIC PANEL
A/G RATIO: 1.8 (ref 1.2–2.2)
ALT: 38 IU/L (ref 0–44)
AST: 34 IU/L (ref 0–40)
Albumin: 4.8 g/dL (ref 3.5–5.5)
Alkaline Phosphatase: 72 IU/L (ref 39–117)
BILIRUBIN TOTAL: 0.4 mg/dL (ref 0.0–1.2)
BUN/Creatinine Ratio: 13 (ref 9–20)
BUN: 14 mg/dL (ref 6–24)
CO2: 25 mmol/L (ref 20–29)
Calcium: 9.6 mg/dL (ref 8.7–10.2)
Chloride: 100 mmol/L (ref 96–106)
Creatinine, Ser: 1.05 mg/dL (ref 0.76–1.27)
GFR calc non Af Amer: 83 mL/min/{1.73_m2} (ref >59)
GFR, EST AFRICAN AMERICAN: 96 mL/min/{1.73_m2} (ref >59)
GLOBULIN, TOTAL: 2.7 g/dL (ref 1.5–4.5)
Glucose: 85 mg/dL (ref 65–99)
POTASSIUM: 4.5 mmol/L (ref 3.5–5.2)
SODIUM: 141 mmol/L (ref 134–144)
Total Protein: 7.5 g/dL (ref 6.0–8.5)

## 2017-11-20 LAB — CBC WITH DIFFERENTIAL/PLATELET
BASOS: 1 %
Basophils Absolute: 0 10*3/uL (ref 0.0–0.2)
EOS (ABSOLUTE): 0.1 10*3/uL (ref 0.0–0.4)
Eos: 1 %
HEMATOCRIT: 47.8 % (ref 37.5–51.0)
Hemoglobin: 16.2 g/dL (ref 13.0–17.7)
Immature Grans (Abs): 0 10*3/uL (ref 0.0–0.1)
Immature Granulocytes: 0 %
LYMPHS ABS: 1.7 10*3/uL (ref 0.7–3.1)
Lymphs: 23 %
MCH: 31.8 pg (ref 26.6–33.0)
MCHC: 33.9 g/dL (ref 31.5–35.7)
MCV: 94 fL (ref 79–97)
MONOS ABS: 0.4 10*3/uL (ref 0.1–0.9)
Monocytes: 6 %
NEUTROS ABS: 5 10*3/uL (ref 1.4–7.0)
NEUTROS PCT: 69 %
Platelets: 224 10*3/uL (ref 150–450)
RBC: 5.1 x10E6/uL (ref 4.14–5.80)
RDW: 13.7 % (ref 12.3–15.4)
WBC: 7.2 10*3/uL (ref 3.4–10.8)

## 2017-11-20 LAB — PHENOBARBITAL LEVEL: Phenobarbital, Serum: 19 ug/mL (ref 15–40)

## 2017-11-20 LAB — LIPID PANEL W/O CHOL/HDL RATIO
Cholesterol, Total: 224 mg/dL — ABNORMAL HIGH (ref 100–199)
HDL: 57 mg/dL (ref >39)
LDL Calculated: 133 mg/dL — ABNORMAL HIGH (ref 0–99)
TRIGLYCERIDES: 171 mg/dL — AB (ref 0–149)
VLDL Cholesterol Cal: 34 mg/dL (ref 5–40)

## 2017-11-20 LAB — PSA: PROSTATE SPECIFIC AG, SERUM: 1.3 ng/mL (ref 0.0–4.0)

## 2017-11-20 LAB — TSH: TSH: 1.37 u[IU]/mL (ref 0.450–4.500)

## 2017-11-22 ENCOUNTER — Other Ambulatory Visit: Payer: Self-pay

## 2017-11-22 DIAGNOSIS — Z1211 Encounter for screening for malignant neoplasm of colon: Secondary | ICD-10-CM

## 2017-11-22 NOTE — Telephone Encounter (Signed)
Phenobarbital 97.2 mg refill request  LOV 11/19/17 with Kathrine Haddock  Last refill:  05/22/17  #30   5 refills  [id number] - [place], [place] - [address]

## 2017-11-26 ENCOUNTER — Other Ambulatory Visit: Payer: Self-pay

## 2017-12-13 ENCOUNTER — Other Ambulatory Visit: Payer: Self-pay | Admitting: Unknown Physician Specialty

## 2017-12-27 ENCOUNTER — Ambulatory Visit: Payer: BLUE CROSS/BLUE SHIELD | Admitting: Unknown Physician Specialty

## 2018-01-15 ENCOUNTER — Other Ambulatory Visit: Payer: Self-pay | Admitting: Unknown Physician Specialty

## 2018-01-24 ENCOUNTER — Encounter: Payer: Self-pay | Admitting: *Deleted

## 2018-01-27 ENCOUNTER — Ambulatory Visit
Admission: RE | Admit: 2018-01-27 | Payer: BLUE CROSS/BLUE SHIELD | Source: Ambulatory Visit | Admitting: Gastroenterology

## 2018-01-27 ENCOUNTER — Encounter: Payer: Self-pay | Admitting: Certified Registered Nurse Anesthetist

## 2018-01-27 ENCOUNTER — Encounter: Admission: RE | Payer: Self-pay | Source: Ambulatory Visit

## 2018-01-27 SURGERY — COLONOSCOPY WITH PROPOFOL
Anesthesia: General

## 2018-03-21 ENCOUNTER — Other Ambulatory Visit: Payer: Self-pay | Admitting: Unknown Physician Specialty

## 2018-03-21 NOTE — Telephone Encounter (Signed)
Tadalafil refill Last Refill:12/16/17 # 4tabs  2refills Last OV: 05/22/17 PCP: Katheren Puller [facility]acy:[facility] [address] [place]

## 2018-03-24 DIAGNOSIS — H0015 Chalazion left lower eyelid: Secondary | ICD-10-CM | POA: Diagnosis not present

## 2018-04-19 ENCOUNTER — Other Ambulatory Visit: Payer: Self-pay | Admitting: Unknown Physician Specialty

## 2018-04-21 NOTE — Telephone Encounter (Signed)
Prescription was written in May for # 30 with 5 refills.

## 2018-04-21 NOTE — Telephone Encounter (Signed)
Please call and schedule follow up per Jolene. Thanks.

## 2018-04-21 NOTE — Telephone Encounter (Signed)
Requested medication (s) are due for refill today: yes  Requested medication (s) are on the active medication list: yes  Last refill:  11/22/17  Future visit scheduled: no  Notes to clinic:  This med is not delegated, a controlled medication. LOV 11/19/17  Requested Prescriptions  Pending Prescriptions Disp Refills   PHENobarbital (LUMINAL) 97.2 MG tablet [Pharmacy Med Name: PHENOBARBITAL 97.2 MG TABLET] 30 tablet     Sig: TAKE 1 TABLET AT BEDTIME     Not Delegated - Neurology: Anticonvulsants - Controlled - phenobarbital Failed - 04/19/2018  6:27 PM      Failed - This refill cannot be delegated      Passed - Phenobarbital in normal range and within 360 days    No results found for: [place] encounter within last 12 months    Recent Outpatient Visits          5 months ago Routine general medical examination at a health care facility   [place], NP   11 months ago Medication monitoring encounter   [facility] Kathrine Haddock, NP   1 year ago Seizure disorder [facility])   [place], NP   3 years ago Seizure disorder [facility])   [facility] Kathrine Haddock, NP

## 2018-04-21 NOTE — Telephone Encounter (Signed)
On review of chart patient has 5 refills on this medication.  Can you please advise him of this.  Thank you.

## 2018-04-21 NOTE — Telephone Encounter (Signed)
Refill approved.  Will ask to see patient in office in 3 months.  30 day supply x 3 refills ordered

## 2018-04-21 NOTE — Telephone Encounter (Signed)
Did not mean to route to provider, routing to front office.

## 2018-04-21 NOTE — Telephone Encounter (Signed)
Thank you.  It is hard to tell sometimes on here.  I refilled.  Would like to see patient in three months though.  I gave 30 days supply with three refills.

## 2018-07-02 ENCOUNTER — Other Ambulatory Visit: Payer: Self-pay | Admitting: Unknown Physician Specialty

## 2018-07-03 NOTE — Telephone Encounter (Signed)
I will refill this time, but can we call and see if he can schedule a yearly visit in May.  As of now he has no appointment scheduled and needs labs coming up.

## 2018-07-03 NOTE — Telephone Encounter (Signed)
Requested medication (s) are due for refill today: Yes  Requested medication (s) are on the active medication list: Yes  Last refill:  03/21/18  Future visit scheduled: No  Notes to clinic:  See request    Requested Prescriptions  Pending Prescriptions Disp Refills   tadalafil (ADCIRCA/CIALIS) 20 MG tablet [Pharmacy Med Name: TADALAFIL 20 MG TABLET] 4 tablet 2    Sig: TAKE 1 TABLET BY MOUTH EVERY DAY AS NEEDED     Urology: Erectile Dysfunction Agents Failed - 07/02/2018  7:52 PM      Failed - Last BP in normal range    BP Readings from Last 1 Encounters:  11/19/17 (!) 152/89         Passed - Valid encounter within last 12 months    Recent Outpatient Visits          7 months ago Routine general medical examination at a health care facility   [place], NP   1 year ago Medication monitoring encounter   [facility] Kathrine Haddock, NP   2 years ago Seizure disorder [facility])   [place], NP   3 years ago Seizure disorder [facility])   [facility] Kathrine Haddock, NP

## 2018-09-09 ENCOUNTER — Other Ambulatory Visit: Payer: Self-pay | Admitting: Unknown Physician Specialty

## 2018-09-09 ENCOUNTER — Other Ambulatory Visit: Payer: Self-pay | Admitting: Nurse Practitioner

## 2018-09-09 DIAGNOSIS — H0014 Chalazion left upper eyelid: Secondary | ICD-10-CM | POA: Diagnosis not present

## 2018-09-09 NOTE — Telephone Encounter (Signed)
Requested medication (s) are due for refill today: Yes  Requested medication (s) are on the active medication list: Yes  Last refill:  07/03/18  Future visit scheduled: No  Notes to clinic:  See request    Requested Prescriptions  Pending Prescriptions Disp Refills   tadalafil (ADCIRCA/CIALIS) 20 MG tablet [Pharmacy Med Name: TADALAFIL 20 MG TABLET] 4 tablet 2    Sig: TAKE 1 TABLET BY MOUTH EVERY DAY AS NEEDED     Urology: Erectile Dysfunction Agents Failed - 09/09/2018 10:01 AM      Failed - Last BP in normal range    BP Readings from Last 1 Encounters:  11/19/17 (!) 152/89         Passed - Valid encounter within last 12 months    Recent Outpatient Visits          9 months ago Routine general medical examination at a health care facility   [place], NP   1 year ago Medication monitoring encounter   [facility] Kathrine Haddock, NP   2 years ago Seizure disorder [facility])   [place], NP   3 years ago Seizure disorder [facility])   [facility] Kathrine Haddock, NP

## 2018-10-22 ENCOUNTER — Other Ambulatory Visit: Payer: Self-pay | Admitting: Nurse Practitioner

## 2018-10-22 NOTE — Telephone Encounter (Signed)
Please advise 

## 2018-10-23 NOTE — Telephone Encounter (Signed)
For further refills on this medication will need appointment with provider.

## 2018-11-11 ENCOUNTER — Other Ambulatory Visit: Payer: Self-pay | Admitting: Nurse Practitioner

## 2018-11-11 NOTE — Telephone Encounter (Signed)
Requested Prescriptions  Pending Prescriptions Disp Refills  . tadalafil (CIALIS) 20 MG tablet [Pharmacy Med Name: TADALAFIL 20 MG TABLET] 4 tablet 1    Sig: TAKE 1 TABLET BY MOUTH EVERY DAY AS NEEDED     Urology: Erectile Dysfunction Agents Failed - 11/11/2018  4:22 PM      Failed - Last BP in normal range    BP Readings from Last 1 Encounters:  11/19/17 (!) 152/89         Passed - Valid encounter within last 12 months    Recent Outpatient Visits          11 months ago Routine general medical examination at a health care facility   [place], NP   1 year ago Medication monitoring encounter   [facility] Kathrine Haddock, NP   2 years ago Seizure disorder [facility])   [place], NP   3 years ago Seizure disorder [facility])   [facility] Kathrine Haddock, NP

## 2018-11-25 ENCOUNTER — Other Ambulatory Visit: Payer: Self-pay | Admitting: Nurse Practitioner

## 2018-11-26 NOTE — Telephone Encounter (Signed)
Appt scheduled for Friday

## 2018-11-28 ENCOUNTER — Other Ambulatory Visit: Payer: Self-pay

## 2018-11-28 ENCOUNTER — Encounter: Payer: Self-pay | Admitting: Nurse Practitioner

## 2018-11-28 ENCOUNTER — Ambulatory Visit (INDEPENDENT_AMBULATORY_CARE_PROVIDER_SITE_OTHER): Payer: BC Managed Care – PPO | Admitting: Nurse Practitioner

## 2018-11-28 VITALS — Temp 98.5°F | Ht 69.0 in | Wt 186.4 lb

## 2018-11-28 DIAGNOSIS — Z125 Encounter for screening for malignant neoplasm of prostate: Secondary | ICD-10-CM

## 2018-11-28 DIAGNOSIS — I10 Essential (primary) hypertension: Secondary | ICD-10-CM | POA: Diagnosis not present

## 2018-11-28 DIAGNOSIS — N529 Male erectile dysfunction, unspecified: Secondary | ICD-10-CM

## 2018-11-28 DIAGNOSIS — Z1329 Encounter for screening for other suspected endocrine disorder: Secondary | ICD-10-CM

## 2018-11-28 DIAGNOSIS — G40909 Epilepsy, unspecified, not intractable, without status epilepticus: Secondary | ICD-10-CM

## 2018-11-28 MED ORDER — PHENOBARBITAL 97.2 MG PO TABS: ORAL_TABLET | ORAL | 3 refills | Status: DC

## 2018-11-28 MED ORDER — LISINOPRIL 5 MG PO TABS: 5.0000 mg | ORAL_TABLET | Freq: Every day | ORAL | 2 refills | Status: DC

## 2018-11-28 MED ORDER — TADALAFIL 20 MG PO TABS: 20.0000 mg | ORAL_TABLET | Freq: Every day | ORAL | 2 refills | Status: DC | PRN

## 2018-11-28 NOTE — Patient Instructions (Signed)
Epilepsy  Epilepsy is a condition in which a person has repeated seizures over time. A seizure is a sudden burst of abnormal electrical and chemical activity in the brain. Seizures can cause a change in attention, behavior, or the ability to remain awake and alert (altered mental status).  Epilepsy increases a person's risk of falls, accidents, and injury. It can also lead to complications, including:   Depression.   Poor memory.   Sudden unexplained death in epilepsy (SUDEP). This complication is rare, and its cause is not known.  Most people with epilepsy lead normal lives.  What are the causes?  This condition may be caused by:   A head injury.   An injury that happens at birth.   A high fever during childhood.   A stroke.   Bleeding that goes into or around the brain.   Certain medicines and drugs.   Having too little oxygen for a long period of time.   Abnormal brain development.   Certain infections, such as meningitis and encephalitis.   Brain tumors.   Conditions that are passed along from parent to child (are hereditary).  What are the signs or symptoms?  Symptoms of a seizure vary greatly from person to person. They include:   Convulsions.   Stiffening of the body.   Involuntary movements of the arms or legs.   Loss of consciousness.   Breathing problems.   Falling suddenly.   Confusion.   Head nodding.   Eye blinking or fluttering.   Lip smacking.   Drooling.   Rapid eye movements.   Grunting.   Loss of bladder control and bowel control.   Staring.   Unresponsiveness.  Some people have symptoms right before a seizure happens (aura) and right after a seizure happens. Symptoms of an aura include:   Fear or anxiety.   Nausea.   Feeling like the room is spinning (vertigo).   A feeling of having seen or heard something before (deja vu).   Odd tastes or smells.   Changes in vision, such as seeing flashing lights or spots.  Symptoms that follow a seizure  include:   Confusion.   Sleepiness.   Headache.  How is this diagnosed?  This condition is diagnosed based on:   Your symptoms.   Your medical history.   A physical exam.   A neurological exam. A neurological exam is similar to a physical exam. It involves checking your strength, reflexes, coordination, and sensations.   Tests, such as:  ? An electroencephalogram (EEG). This is a painless test that creates a diagram of your brain waves.  ? An MRI of the brain.  ? A CT scan of the brain.  ? A lumbar puncture, also called a spinal tap.  ? Blood tests to check for signs of infection or abnormal blood chemistry.  How is this treated?  There is no cure for this condition, but treatment can help control seizures. Treatment may involve:   Taking medicines to control seizures. These include medicines to prevent seizures and medicines to stop seizures as they occur.   Having a device called a vagus nerve stimulator implanted in the chest. The device sends electrical impulses to the vagus nerve and to the brain to prevent seizures. This treatment may be recommended if medicines do not help.   Brain surgery. There are several kinds of surgeries that may be done to stop seizures from happening or to reduce how often seizures happen.   Having regular   blood tests. You may need to have blood tests regularly to check that you are getting the right amount of medicine.  Once this condition has been diagnosed, it is important to begin treatment as soon as possible. For some people, epilepsy eventually goes away.  Follow these instructions at home:  Medicines     Take over-the-counter and prescription medicines only as told by your health care provider.   Avoid any substances that may prevent your medicine from working properly, such as alcohol.  Activity   Get enough rest. Lack of sleep can make seizures more likely to occur.   Follow instructions from your health care provider about driving, swimming, and doing any  other activities that would be dangerous if you had a seizure.  Educating others  Teach friends and family what to do if you have a seizure. They should:   Lay you on the ground to prevent a fall.   Cushion your head and body.   Loosen any tight clothing around your neck.   Turn you on your side. If vomiting occurs, this helps keep your airway clear.   Stay with you until you recover.   Not hold you down. Holding you down will not stop the seizure.   Not put anything in your mouth.   Know whether or not you need emergency care.  General instructions   Avoid anything that has ever triggered a seizure for you.   Keep a seizure diary. Record what you remember about each seizure, especially anything that might have triggered the seizure.   Keep all follow-up visits as told by your health care provider. This is important.  Contact a health care provider if:   Your seizure pattern changes.   You have symptoms of infection or another illness. This might increase your risk of having a seizure.  Get help right away if:   You have a seizure that does not stop after 5 minutes.   You have several seizures in a row without a complete recovery in between seizures.   You have a seizure that makes it harder to breathe.   You have a seizure that is different from previous seizures.   You have a seizure that leaves you unable to speak or use a part of your body.   You did not wake up immediately after a seizure.  This information is not intended to replace advice given to you by your health care provider. Make sure you discuss any questions you have with your health care provider.  Document Released: 06/11/2005 Document Revised: 01/07/2016 Document Reviewed: 12/20/2015  Elsevier Interactive Patient Education  2019 [employer]

## 2018-11-28 NOTE — Assessment & Plan Note (Signed)
Stable, continue current medication regimen.  Refill sent.

## 2018-11-28 NOTE — Progress Notes (Signed)
Temp 98.5 F (36.9 C) (Oral)   Ht 5\' 9"  (1.753 m)   Wt 186 lb 6.4 oz (84.6 kg)   BMI 27.53 kg/m    Subjective:    Patient ID: Dillon Blake, male    DOB: [birth year], 53 y.o.   MRN: [id number]  HPI: Dillon Blake is a 53 y.o. male  Chief Complaint  Patient presents with  . Hypertension    lisinopril and cialis refill    . This visit was completed via WebEx due to the restrictions of the COVID-19 pandemic. All issues as above were discussed and addressed. Physical exam was done as above through visual confirmation on Webex. If it was felt that the patient should be evaluated in the office, they were directed there. The patient verbally consented to this visit. . Location of the patient: home . Location of the provider: home . Those involved with this call:  . Provider: Marnee Guarneri, DNP . CMA: Yvonna Alanis, CMA . Front Desk/Registration: Jill Side  . Time spent on call: 15 minutes with patient face to face via video conference. More than 50% of this time was spent in counseling and coordination of care. 10 minutes total spent in review of patient's record and preparation of their chart. I verified patient identity using two factors (patient name and date of birth). Patient consents verbally to being seen via telemedicine visit today.   Has not been seen in office since 11/09/17, when last labs were performed.    HYPERTENSION Hypertension status: controlled  Satisfied with current treatment? yes Duration of hypertension: chronic BP monitoring frequency:  weekly BP range: 138/85 last week BP medication side effects:  no Medication compliance: good compliance Aspirin: no Recurrent headaches: no Visual changes: no Palpitations: no Dyspnea: no Chest pain: no Lower extremity edema: no Dizzy/lightheaded: no   SEIZURE DISORDER: Last phenobarbital level was 11/09/17 = 19.  Continues on 97.2 MG at bedtime, whch is dose her has been on for many years since  diagnosis.  Last seizure was in April 1991, first one was June of 1987.  Has only had two seizures in his life.  Did see neurology years ago and they did sleep testing, he reports they found that when he had lack of sleep this led to seizures.  So he stays on adequate sleep regimen at home.  ERECTILE DYSFUNCTION: Continues on Cialis.  Reports stable control of ED with this medication.  Denies any concerns or questions.  Has been on for years.  Did have urology evaluation in past due to kidney stones.     Relevant past medical, surgical, family and social history reviewed and updated as indicated. Interim medical history since our last visit reviewed. Allergies and medications reviewed and updated.  Review of Systems  Constitutional: Negative for activity change, diaphoresis, fatigue and fever.  Respiratory: Negative for cough, chest tightness, shortness of breath and wheezing.   Cardiovascular: Negative for chest pain, palpitations and leg swelling.  Gastrointestinal: Negative for abdominal distention, abdominal pain, constipation, diarrhea, nausea and vomiting.  Musculoskeletal: Negative.   Skin: Negative.   Neurological: Negative for dizziness, syncope, weakness, light-headedness, numbness and headaches.  Psychiatric/Behavioral: Negative.     Per HPI unless specifically indicated above     Objective:    Temp 98.5 F (36.9 C) (Oral)   Ht 5\' 9"  (1.753 m)   Wt 186 lb 6.4 oz (84.6 kg)   BMI 27.53 kg/m   Wt Readings from Last 3 Encounters:  11/28/18 186  lb 6.4 oz (84.6 kg)  11/19/17 186 lb 3.2 oz (84.5 kg)  05/22/17 193 lb 1.6 oz (87.6 kg)    Physical Exam Vitals signs and nursing note reviewed.  Constitutional:      General: He is awake. He is not in acute distress.    Appearance: He is well-developed. He is not ill-appearing.  HENT:     Head: Normocephalic.     Right Ear: Hearing normal. No drainage.     Left Ear: Hearing normal. No drainage.  Eyes:     General: Lids are  normal.        Right eye: No discharge.        Left eye: No discharge.     Conjunctiva/sclera: Conjunctivae normal.  Neck:     Musculoskeletal: Normal range of motion.  Cardiovascular:     Comments: Unable to auscultate due to virtual exam only  Pulmonary:     Effort: Pulmonary effort is normal. No accessory muscle usage or respiratory distress.     Comments: Unable to auscultate due to virtual exam only Neurological:     Mental Status: He is alert and oriented to person, place, and time.  Psychiatric:        Mood and Affect: Mood normal.        Behavior: Behavior normal. Behavior is cooperative.        Thought Content: Thought content normal.        Judgment: Judgment normal.     Results for orders placed or performed in visit on 11/19/17  CBC with Differential/Platelet  Result Value Ref Range   WBC 7.2 3.4 - 10.8 x10E3/uL   RBC 5.10 4.14 - 5.80 x10E6/uL   Hemoglobin 16.2 13.0 - 17.7 g/dL   Hematocrit 47.8 37.5 - 51.0 %   MCV 94 79 - 97 fL   MCH 31.8 26.6 - 33.0 pg   MCHC 33.9 31.5 - 35.7 g/dL   RDW 13.7 12.3 - 15.4 %   Platelets 224 150 - 450 x10E3/uL   Neutrophils 69 Not Estab. %   Lymphs 23 Not Estab. %   Monocytes 6 Not Estab. %   Eos 1 Not Estab. %   Basos 1 Not Estab. %   Neutrophils Absolute 5.0 1.4 - 7.0 x10E3/uL   Lymphocytes Absolute 1.7 0.7 - 3.1 x10E3/uL   Monocytes Absolute 0.4 0.1 - 0.9 x10E3/uL   EOS (ABSOLUTE) 0.1 0.0 - 0.4 x10E3/uL   Basophils Absolute 0.0 0.0 - 0.2 x10E3/uL   Immature Granulocytes 0 Not Estab. %   Immature Grans (Abs) 0.0 0.0 - 0.1 x10E3/uL  Comprehensive metabolic panel  Result Value Ref Range   Glucose 85 65 - 99 mg/dL   BUN 14 6 - 24 mg/dL   Creatinine, Ser 1.05 0.76 - 1.27 mg/dL   GFR calc non Af Amer 83 >59 mL/min/1.73   GFR calc Af Amer 96 >59 mL/min/1.73   BUN/Creatinine Ratio 13 9 - 20   Sodium 141 134 - 144 mmol/L   Potassium 4.5 3.5 - 5.2 mmol/L   Chloride 100 96 - 106 mmol/L   CO2 25 20 - 29 mmol/L   Calcium 9.6  8.7 - 10.2 mg/dL   Total Protein 7.5 6.0 - 8.5 g/dL   Albumin 4.8 3.5 - 5.5 g/dL   Globulin, Total 2.7 1.5 - 4.5 g/dL   Albumin/Globulin Ratio 1.8 1.2 - 2.2   Bilirubin Total 0.4 0.0 - 1.2 mg/dL   Alkaline Phosphatase 72 39 - 117 IU/L  AST 34 0 - 40 IU/L   ALT 38 0 - 44 IU/L  Lipid Panel w/o Chol/HDL Ratio  Result Value Ref Range   Cholesterol, Total 224 (H) 100 - 199 mg/dL   Triglycerides 171 (H) 0 - 149 mg/dL   HDL 57 >39 mg/dL   VLDL Cholesterol Cal 34 5 - 40 mg/dL   LDL Calculated 133 (H) 0 - 99 mg/dL  TSH  Result Value Ref Range   TSH 1.370 0.450 - 4.500 uIU/mL  PSA  Result Value Ref Range   Prostate Specific Ag, Serum 1.3 0.0 - 4.0 ng/mL  Phenobarbital level  Result Value Ref Range   Phenobarbital, Serum 19 15 - 40 ug/mL      Assessment & Plan:   Problem List Items Addressed This Visit      Cardiovascular and Mediastinum   Hypertension    Chronic, ongoing.  BP at goal on home readings.  Continue current medication regimen and recommend checking BP at home three mornings a week and notify provider if above goal consistently >130/90.  Will obtain outpatient labs.        Relevant Medications   lisinopril (ZESTRIL) 5 MG tablet   tadalafil (CIALIS) 20 MG tablet   Other Relevant Orders   Comprehensive metabolic panel   CBC with Differential/Platelet     Nervous and Auditory   Seizure disorder (HCC) - Primary    Chronic, stable with no seizure since 1991.  Continue current medication regimen and check phenobarbital level outpatient.        Relevant Medications   PHENobarbital (LUMINAL) 97.2 MG tablet   Other Relevant Orders   Comprehensive metabolic panel   Lipid Panel w/o Chol/HDL Ratio   Phenobarbital level     Other   ED (erectile dysfunction)    Stable, continue current medication regimen.  Refill sent.       Other Visit Diagnoses    Prostate cancer screening       PSA outpatient labs   Relevant Orders   PSA   Thyroid disorder screen       TSH  outpatient labs   Relevant Orders   TSH      I discussed the assessment and treatment plan with the patient. The patient was provided an opportunity to ask questions and all were answered. The patient agreed with the plan and demonstrated an understanding of the instructions.   The patient was advised to call back or seek an in-person evaluation if the symptoms worsen or if the condition fails to improve as anticipated.   I provided 15 minutes of time during this encounter.  Follow up plan: Return in about 1 year (around 11/28/2019) for Physical exam.

## 2018-11-28 NOTE — Assessment & Plan Note (Signed)
Chronic, stable with no seizure since 1991.  Continue current medication regimen and check phenobarbital level outpatient.

## 2018-11-28 NOTE — Assessment & Plan Note (Signed)
Chronic, ongoing.  BP at goal on home readings.  Continue current medication regimen and recommend checking BP at home three mornings a week and notify provider if above goal consistently >130/90.  Will obtain outpatient labs.

## 2019-01-12 ENCOUNTER — Other Ambulatory Visit: Payer: Self-pay | Admitting: Nurse Practitioner

## 2019-01-12 NOTE — Telephone Encounter (Signed)
Will refill for 30 days, but for further refills he needs to come in for lab visit.  Was seen in June, but has not come in for labs which are ordered.  Thanks.  Can we call to schedule lab visit with him please?

## 2019-01-12 NOTE — Telephone Encounter (Signed)
Requested medications are due for refill today? No  Requested medications are on the active medication list?  Yes  Last refill 11/28/2018  Future visit scheduled?  No  Notes to clinic   Requested Prescriptions  Pending Prescriptions Disp Refills   PHENobarbital (LUMINAL) 97.2 MG tablet [Pharmacy Med Name: PHENOBARBITAL 97.2 MG TABLET] 30 tablet 3    Sig: TAKE 1 TABLET BY MOUTH EVERYDAY AT BEDTIME     Not Delegated - Neurology: Anticonvulsants - Controlled - phenobarbital Failed - 01/12/2019  1:17 AM      Failed - This refill cannot be delegated      Failed - Phenobarbital in normal range and within 360 days    No results found for: [place] encounter within last 12 months    Recent Outpatient Visits          1 month ago Seizure disorder ([place])   [place], Henrine Screws T, NP   1 year ago Routine general medical examination at a health care facility   [place], NP   1 year ago Medication monitoring encounter   [facility] Kathrine Haddock, NP   2 years ago Seizure disorder [facility])   [place], NP   3 years ago Seizure disorder [facility])   [facility] Kathrine Haddock, NP

## 2019-01-12 NOTE — Telephone Encounter (Signed)
Needs labs which are ordered.  Have notified staff to schedule lab visit for patient.

## 2019-01-13 ENCOUNTER — Encounter: Payer: Self-pay | Admitting: Nurse Practitioner

## 2019-01-13 NOTE — Telephone Encounter (Signed)
LVM for pt to call back.

## 2019-01-13 NOTE — Telephone Encounter (Signed)
Letter printed to mail. °

## 2019-01-14 ENCOUNTER — Other Ambulatory Visit: Payer: Self-pay | Admitting: Nurse Practitioner

## 2019-01-14 NOTE — Telephone Encounter (Signed)
Requested medication (s) are due for refill today:     Requested medication (s) are on the active medication list:   Yes  Future visit scheduled:   Yes 01/23/2019 with [place]   Last ordered: 11/28/2018  #4  2 refills   Requested Prescriptions  Pending Prescriptions Disp Refills   tadalafil (CIALIS) 20 MG tablet [Pharmacy Med Name: TADALAFIL 20 MG TABLET] 4 tablet 2    Sig: TAKE 1 TABLET BY MOUTH EVERY DAY AS NEEDED     Urology: Erectile Dysfunction Agents Failed - 01/14/2019  1:24 PM      Failed - Last BP in normal range    BP Readings from Last 1 Encounters:  11/19/17 (!) 152/89         Passed - Valid encounter within last 12 months    Recent Outpatient Visits          1 month ago Seizure disorder ([place])   [place], Henrine Screws T, NP   1 year ago Routine general medical examination at a health care facility   [place], NP   1 year ago Medication monitoring encounter   [place], NP   2 years ago Seizure disorder [facility])   [place], NP   3 years ago Seizure disorder [facility])   [facility] Kathrine Haddock, NP

## 2019-01-23 ENCOUNTER — Other Ambulatory Visit: Payer: Self-pay

## 2019-01-23 ENCOUNTER — Other Ambulatory Visit: Payer: BC Managed Care – PPO

## 2019-01-23 DIAGNOSIS — G40909 Epilepsy, unspecified, not intractable, without status epilepticus: Secondary | ICD-10-CM | POA: Diagnosis not present

## 2019-01-23 DIAGNOSIS — Z1329 Encounter for screening for other suspected endocrine disorder: Secondary | ICD-10-CM | POA: Diagnosis not present

## 2019-01-23 DIAGNOSIS — I10 Essential (primary) hypertension: Secondary | ICD-10-CM

## 2019-01-23 DIAGNOSIS — Z125 Encounter for screening for malignant neoplasm of prostate: Secondary | ICD-10-CM

## 2019-01-24 LAB — LIPID PANEL W/O CHOL/HDL RATIO
Cholesterol, Total: 222 mg/dL — ABNORMAL HIGH (ref 100–199)
HDL: 54 mg/dL
LDL Calculated: 129 mg/dL — ABNORMAL HIGH (ref 0–99)
Triglycerides: 195 mg/dL — ABNORMAL HIGH (ref 0–149)
VLDL Cholesterol Cal: 39 mg/dL (ref 5–40)

## 2019-01-24 LAB — CBC WITH DIFFERENTIAL/PLATELET
Basophils Absolute: 0.1 x10E3/uL (ref 0.0–0.2)
Basos: 1 %
EOS (ABSOLUTE): 0.1 x10E3/uL (ref 0.0–0.4)
Eos: 1 %
Hematocrit: 47.6 % (ref 37.5–51.0)
Hemoglobin: 16.9 g/dL (ref 13.0–17.7)
Immature Grans (Abs): 0 x10E3/uL (ref 0.0–0.1)
Immature Granulocytes: 0 %
Lymphocytes Absolute: 2.3 x10E3/uL (ref 0.7–3.1)
Lymphs: 33 %
MCH: 31.5 pg (ref 26.6–33.0)
MCHC: 35.5 g/dL (ref 31.5–35.7)
MCV: 89 fL (ref 79–97)
Monocytes Absolute: 0.5 x10E3/uL (ref 0.1–0.9)
Monocytes: 7 %
Neutrophils Absolute: 3.9 x10E3/uL (ref 1.4–7.0)
Neutrophils: 58 %
Platelets: 250 x10E3/uL (ref 150–450)
RBC: 5.37 x10E6/uL (ref 4.14–5.80)
RDW: 12.4 % (ref 11.6–15.4)
WBC: 6.9 x10E3/uL (ref 3.4–10.8)

## 2019-01-24 LAB — COMPREHENSIVE METABOLIC PANEL WITH GFR
ALT: 44 IU/L (ref 0–44)
AST: 28 IU/L (ref 0–40)
Albumin/Globulin Ratio: 1.8 (ref 1.2–2.2)
Albumin: 4.9 g/dL (ref 3.8–4.9)
Alkaline Phosphatase: 79 IU/L (ref 39–117)
BUN/Creatinine Ratio: 13 (ref 9–20)
BUN: 13 mg/dL (ref 6–24)
Bilirubin Total: 0.4 mg/dL (ref 0.0–1.2)
CO2: 24 mmol/L (ref 20–29)
Calcium: 9.4 mg/dL (ref 8.7–10.2)
Chloride: 102 mmol/L (ref 96–106)
Creatinine, Ser: 1 mg/dL (ref 0.76–1.27)
GFR calc Af Amer: 100 mL/min/1.73
GFR calc non Af Amer: 87 mL/min/1.73
Globulin, Total: 2.7 g/dL (ref 1.5–4.5)
Glucose: 81 mg/dL (ref 65–99)
Potassium: 4.4 mmol/L (ref 3.5–5.2)
Sodium: 143 mmol/L (ref 134–144)
Total Protein: 7.6 g/dL (ref 6.0–8.5)

## 2019-01-24 LAB — TSH: TSH: 1.22 u[IU]/mL (ref 0.450–4.500)

## 2019-01-24 LAB — PSA: Prostate Specific Ag, Serum: 0.9 ng/mL (ref 0.0–4.0)

## 2019-01-24 LAB — PHENOBARBITAL LEVEL: Phenobarbital, Serum: 15 ug/mL (ref 15–40)

## 2019-02-25 ENCOUNTER — Other Ambulatory Visit: Payer: Self-pay | Admitting: Nurse Practitioner

## 2019-03-18 ENCOUNTER — Other Ambulatory Visit: Payer: Self-pay | Admitting: Nurse Practitioner

## 2019-03-18 NOTE — Telephone Encounter (Signed)
Requested medication (s) are due for refill today: yes  Requested medication (s) are on the active medication list: yes   Last refill: 01/12/2019  #30  1 refill  Future visit scheduled No  Notes to clinic:Not delegated  Requested Prescriptions  Pending Prescriptions Disp Refills   PHENobarbital (LUMINAL) 97.2 MG tablet [Pharmacy Med Name: PHENOBARBITAL 97.2 MG TABLET] 30 tablet 1    Sig: TAKE 1 TABLET BY MOUTH EVERYDAY AT BEDTIME     Not Delegated - Neurology: Anticonvulsants - Controlled - phenobarbital Failed - 03/18/2019  6:18 PM      Failed - This refill cannot be delegated      Passed - Phenobarbital in normal range and within 360 days    No results found for: [place] encounter within last 12 months    Recent Outpatient Visits          3 months ago Seizure disorder ([place])   [place], Henrine Screws T, NP   1 year ago Routine general medical examination at a health care facility   [place], NP   1 year ago Medication monitoring encounter   [facility] Kathrine Haddock, NP   2 years ago Seizure disorder [facility])   [place], NP   3 years ago Seizure disorder [facility])   [facility] Kathrine Haddock, NP

## 2019-03-19 NOTE — Telephone Encounter (Signed)
For seizure disorder, stable on this regimen.  Last seen in June.

## 2019-03-19 NOTE — Telephone Encounter (Signed)
Routing to provider  

## 2019-04-01 ENCOUNTER — Other Ambulatory Visit: Payer: Self-pay | Admitting: Nurse Practitioner

## 2019-04-01 NOTE — Telephone Encounter (Signed)
Forwarding medication refill request to PCP for review. 

## 2019-04-02 NOTE — Telephone Encounter (Signed)
Would like face to face scheduled for in office for January please.  Will refill x 1 at this time.

## 2019-04-02 NOTE — Telephone Encounter (Signed)
Called and left a message for patient to return my call to schedule a follow up with Jolene.

## 2019-04-03 NOTE — Telephone Encounter (Signed)
Letter generated and sent to patient.  

## 2019-04-03 NOTE — Telephone Encounter (Signed)
Called patient. Left VM for patient to return call to the office. 

## 2019-04-22 ENCOUNTER — Other Ambulatory Visit: Payer: Self-pay | Admitting: Nurse Practitioner

## 2019-04-24 ENCOUNTER — Other Ambulatory Visit: Payer: Self-pay | Admitting: Nurse Practitioner

## 2019-05-27 ENCOUNTER — Other Ambulatory Visit: Payer: Self-pay | Admitting: Nurse Practitioner

## 2019-06-15 ENCOUNTER — Other Ambulatory Visit: Payer: Self-pay | Admitting: Nurse Practitioner

## 2019-06-28 ENCOUNTER — Encounter: Payer: Self-pay | Admitting: Nurse Practitioner

## 2019-06-28 DIAGNOSIS — E7800 Pure hypercholesterolemia, unspecified: Secondary | ICD-10-CM | POA: Insufficient documentation

## 2019-07-03 ENCOUNTER — Ambulatory Visit: Payer: BC Managed Care – PPO | Admitting: Nurse Practitioner

## 2019-07-15 ENCOUNTER — Other Ambulatory Visit: Payer: Self-pay | Admitting: Nurse Practitioner

## 2019-07-29 ENCOUNTER — Other Ambulatory Visit: Payer: Self-pay | Admitting: Nurse Practitioner

## 2019-08-06 ENCOUNTER — Telehealth: Payer: Self-pay | Admitting: Nurse Practitioner

## 2019-08-06 ENCOUNTER — Ambulatory Visit (INDEPENDENT_AMBULATORY_CARE_PROVIDER_SITE_OTHER): Payer: BC Managed Care – PPO | Admitting: Nurse Practitioner

## 2019-08-06 ENCOUNTER — Encounter: Payer: Self-pay | Admitting: Nurse Practitioner

## 2019-08-06 VITALS — Temp 97.1°F

## 2019-08-06 DIAGNOSIS — I10 Essential (primary) hypertension: Secondary | ICD-10-CM | POA: Diagnosis not present

## 2019-08-06 DIAGNOSIS — M654 Radial styloid tenosynovitis [de Quervain]: Secondary | ICD-10-CM | POA: Diagnosis not present

## 2019-08-06 DIAGNOSIS — E7800 Pure hypercholesterolemia, unspecified: Secondary | ICD-10-CM

## 2019-08-06 DIAGNOSIS — G40909 Epilepsy, unspecified, not intractable, without status epilepticus: Secondary | ICD-10-CM | POA: Diagnosis not present

## 2019-08-06 MED ORDER — LISINOPRIL 5 MG PO TABS: 5.0000 mg | ORAL_TABLET | Freq: Every day | ORAL | 4 refills | Status: DC

## 2019-08-06 MED ORDER — PHENOBARBITAL 97.2 MG PO TABS: ORAL_TABLET | ORAL | 3 refills | Status: DC

## 2019-08-06 NOTE — Patient Instructions (Signed)
Preventing Yoga Injuries  Yoga is a very popular mind-body exercise. People practice yoga for fitness, relaxation, and emotional health. Yoga may also be helpful for relieving the pain of some muscle and joint disorders, such as tennis elbow, low back pain, and arthritis. Yoga poses involve moving and stretching your muscles and joints. Yoga can increase strength, flexibility, and balance. Injuries from yoga are rare if the poses are done correctly. Injuries can occur from overstretching or stretching incorrectly. There are several steps you can take to lower your risk of a yoga injury. How can these injuries affect me? Injuries from yoga can include strained muscles and injuries to the tissues that connect muscles to bones (tendon strains) and the tissues that connect bones to other bones (ligament sprains). A back injury is the most common type of yoga injury. Other injuries can occur in the shoulders, wrists, and knees. What actions can I take to prevent yoga injuries? Take safety measures before you begin  Check with your health care provider before you begin yoga. Make sure yoga is safe for you. Tell your health care provider about all your medical conditions and any previous injuries.  Learn about the different types of yoga before trying a class. Some types are more vigorous than others.  Find a Forensic psychologist. Tell the instructor about any physical limitations you have so that he or she can adjust your yoga program to your ability. Follow guidelines for safe exercise   Wear loose, comfortable clothing to allow for easy movement.  Warm up and stretch before every yoga session. Cold muscles are more easily injured. Warm up with some light jogging or exercise for 3-5 minutes. Follow your warm-up with some gentle stretching. After your yoga session, repeat the stretching during your cool-down period.  Start slowly and avoid any advanced postures when you are first starting out.  Advanced postures include headstand, shoulder stand, and lotus postures.  Listen to your body. Stop if you have any pain.  Drink water before and during exercise, even if you are not thirsty. This is especially important if you are doing hot yoga.  Do not return to doing yoga after an injury until you have been cleared by your health care provider. Let your yoga instructor know about your injury. Do not try to exercise through the pain.  Maintain your basic fitness level along with your yoga program. Stay in shape by exercising for at least 30 minutes at least 5 days of the week. Where to find more information  American Academy of Orthopaedic Surgeons: orthoinfo.aaos.[employer] of Health: www.niams.[employer].es Contact a health care provider if you have:  Pain during or after yoga.  Pain when at rest.  Swelling or bruising.  Stiffness or limited movement. Summary  Yoga is a type of mind-body exercise that can improve your strength, flexibility, and balance.  Yoga poses involve moving and stretching your muscles and joints. Injuries are rare but can happen from overstretching or stretching incorrectly.  Work with a Forensic psychologist. Tell your instructor about any physical limitations you have prior to beginning yoga.  Listen to your body. Stop if you feel pain in any yoga position. Do not try to exercise through the pain.  Do not return to doing yoga after an injury until you have been cleared by your health care provider. Let your yoga instructor know about your injury. This information is not intended to replace advice given to you by your health care provider. Make  sure you discuss any questions you have with your health care provider. Document Revised: 02/13/2018 Document Reviewed: 02/13/2018 Elsevier Patient Education  2020 [employer].

## 2019-08-06 NOTE — Assessment & Plan Note (Addendum)
Chronic, stable with no seizure since 1991.  Continue current medication regimen and check phenobarbital level at physical in July.  Referral to neuro if any worsening seizures present.  Refills sent.

## 2019-08-06 NOTE — Assessment & Plan Note (Signed)
Continue diet focus at this time, will recheck at physical in July.

## 2019-08-06 NOTE — Progress Notes (Signed)
Temp (!) 97.1 F (36.2 C)    Subjective:    Patient ID: Dillon Blake, male    DOB: 17-Mar-1966, 54 y.o.   MRN: [id number]  HPI: Dillon Blake is a 54 y.o. male  Chief Complaint  Patient presents with  . Hypertension  . Seizures  . Thumb Pain    . This visit was completed via MyChart due to the restrictions of the COVID-19 pandemic. All issues as above were discussed and addressed. Physical exam was done as above through visual confirmation on MyChart. If it was felt that the patient should be evaluated in the office, they were directed there. The patient verbally consented to this visit. . Location of the patient: home . Location of the provider: work . Those involved with this call:  . Provider: Marnee Guarneri, DNP . CMA: Yvonna Alanis, CMA . Front Desk/Registration: Don Perking  . Time spent on call: 15 minutes with patient face to face via video conference. More than 50% of this time was spent in counseling and coordination of care. 10 minutes total spent in review of patient's record and preparation of their chart.   HYPERTENSION Continues on Lisinopril 5 MG daily.  LDL was elevated on recent labs, 129. Hypertension status: controlled  Satisfied with current treatment? yes Duration of hypertension: chronic BP monitoring frequency:  not checking BP range:  BP medication side effects:  no Medication compliance: good compliance Aspirin: no Recurrent headaches: no Visual changes: no Palpitations: no Dyspnea: no Chest pain: no Lower extremity edema: no Dizzy/lightheaded: no  The ASCVD Risk score Mikey Bussing DC Jr., et al., 2013) failed to calculate for the following reasons:   The systolic blood pressure is missing   SEIZURE DISORDER: Last phenobarbital level was 01/23/2019 = 15.  Continues on 97.2 MG at bedtime, whch is dose he has been on for many years since diagnosis.  Last seizure was in April 1991, first one was June of 1987.  Has only had two  seizures in his life.  Did see neurology years ago and they did sleep testing, he reports they found that when he had lack of sleep this led to seizures.  So he stays on adequate sleep regimen at home.  PMP review last fill 07/12/2019, no other controlled substances noted.  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  He has figured out ways to deal with it, but now at work uses hands often and this is becoming worse.  Left thumb and is left hand dominant.  Is beginning to get numb and having issues grasping and opening items.  Was told someday he may need injection to thumb. Duration: chronic Involved hand: left Mechanism of injury: unknown Location: thumb anterior Onset: gradual Severity: 5/10  Quality: dull and aching Frequency: intermittent Radiation: no Aggravating factors: Movement Alleviating factors: Tylenol and Motrin Treatments attempted: Tylenol and Motrin Relief with NSAIDs?: moderate Weakness: yes Numbness: yes Redness: no Swelling:no Bruising: no Fevers: no   Relevant past medical, surgical, family and social history reviewed and updated as indicated. Interim medical history since our last visit reviewed. Allergies and medications reviewed and updated.  Review of Systems  Constitutional: Negative for activity change, diaphoresis, fatigue and fever.  Respiratory: Negative for cough, chest tightness, shortness of breath and wheezing.   Cardiovascular: Negative for chest pain, palpitations and leg swelling.  Gastrointestinal: Negative.  Negative for nausea.  Musculoskeletal: Positive for arthralgias.  Psychiatric/Behavioral: Negative.     Per HPI unless  specifically indicated above     Objective:    Temp (!) 97.1 F (36.2 C)   Wt Readings from Last 3 Encounters:  11/28/18 186 lb 6.4 oz (84.6 kg)  11/19/17 186 lb 3.2 oz (84.5 kg)  05/22/17 193 lb 1.6 oz (87.6 kg)    Physical Exam Vitals and nursing note reviewed.   Constitutional:      General: He is awake. He is not in acute distress.    Appearance: He is well-developed. He is not ill-appearing.  HENT:     Head: Normocephalic.     Right Ear: Hearing normal. No drainage.     Left Ear: Hearing normal. No drainage.  Eyes:     General: Lids are normal.        Right eye: No discharge.        Left eye: No discharge.     Conjunctiva/sclera: Conjunctivae normal.  Pulmonary:     Effort: Pulmonary effort is normal. No accessory muscle usage or respiratory distress.  Musculoskeletal:     Right hand: No swelling, deformity, lacerations or tenderness. Normal range of motion.     Left hand: Tenderness (lower anterior thumb on self palpation) present. No swelling, deformity or lacerations. Normal range of motion.     Cervical back: Normal range of motion.     Comments: Virtual visit only, he does report decreased strength left thumb.  Neurological:     Mental Status: He is alert and oriented to person, place, and time.  Psychiatric:        Mood and Affect: Mood normal.        Behavior: Behavior normal. Behavior is cooperative.        Thought Content: Thought content normal.        Judgment: Judgment normal.     Results for orders placed or performed in visit on 01/23/19  Phenobarbital level  Result Value Ref Range   Phenobarbital, Serum 15 15 - 40 ug/mL  Lipid Panel w/o Chol/HDL Ratio  Result Value Ref Range   Cholesterol, Total 222 (H) 100 - 199 mg/dL   Triglycerides 195 (H) 0 - 149 mg/dL   HDL 54 >39 mg/dL   VLDL Cholesterol Cal 39 5 - 40 mg/dL   LDL Calculated 129 (H) 0 - 99 mg/dL  CBC with Differential/Platelet  Result Value Ref Range   WBC 6.9 3.4 - 10.8 x10E3/uL   RBC 5.37 4.14 - 5.80 x10E6/uL   Hemoglobin 16.9 13.0 - 17.7 g/dL   Hematocrit 47.6 37.5 - 51.0 %   MCV 89 79 - 97 fL   MCH 31.5 26.6 - 33.0 pg   MCHC 35.5 31.5 - 35.7 g/dL   RDW 12.4 11.6 - 15.4 %   Platelets 250 150 - 450 x10E3/uL   Neutrophils 58 Not Estab. %   Lymphs  33 Not Estab. %   Monocytes 7 Not Estab. %   Eos 1 Not Estab. %   Basos 1 Not Estab. %   Neutrophils Absolute 3.9 1.4 - 7.0 x10E3/uL   Lymphocytes Absolute 2.3 0.7 - 3.1 x10E3/uL   Monocytes Absolute 0.5 0.1 - 0.9 x10E3/uL   EOS (ABSOLUTE) 0.1 0.0 - 0.4 x10E3/uL   Basophils Absolute 0.1 0.0 - 0.2 x10E3/uL   Immature Granulocytes 0 Not Estab. %   Immature Grans (Abs) 0.0 0.0 - 0.1 x10E3/uL  Comprehensive metabolic panel  Result Value Ref Range   Glucose 81 65 - 99 mg/dL   BUN 13 6 - 24 mg/dL  Creatinine, Ser 1.00 0.76 - 1.27 mg/dL   GFR calc non Af Amer 87 >59 mL/min/1.73   GFR calc Af Amer 100 >59 mL/min/1.73   BUN/Creatinine Ratio 13 9 - 20   Sodium 143 134 - 144 mmol/L   Potassium 4.4 3.5 - 5.2 mmol/L   Chloride 102 96 - 106 mmol/L   CO2 24 20 - 29 mmol/L   Calcium 9.4 8.7 - 10.2 mg/dL   Total Protein 7.6 6.0 - 8.5 g/dL   Albumin 4.9 3.8 - 4.9 g/dL   Globulin, Total 2.7 1.5 - 4.5 g/dL   Albumin/Globulin Ratio 1.8 1.2 - 2.2   Bilirubin Total 0.4 0.0 - 1.2 mg/dL   Alkaline Phosphatase 79 39 - 117 IU/L   AST 28 0 - 40 IU/L   ALT 44 0 - 44 IU/L  TSH  Result Value Ref Range   TSH 1.220 0.450 - 4.500 uIU/mL  PSA  Result Value Ref Range   Prostate Specific Ag, Serum 0.9 0.0 - 4.0 ng/mL      Assessment & Plan:   Problem List Items Addressed This Visit      Cardiovascular and Mediastinum   Hypertension    Chronic and stable.  Continue current medication regimen and adjust as needed.  Recommend he check BP at home at least three mornings a week.  Refill sent.  Obtain labs at physical in July.      Relevant Medications   lisinopril (ZESTRIL) 5 MG tablet     Nervous and Auditory   Seizure disorder (HCC) - Primary    Chronic, stable with no seizure since 1991.  Continue current medication regimen and check phenobarbital level at physical in July.  Referral to neuro if any worsening seizures present.  Refills sent.      Relevant Medications   PHENobarbital (LUMINAL)  97.2 MG tablet     Musculoskeletal and Integument   Tendinitis, de Quervain's    Referral to ortho for hand specialist in [place].  Recommend continued simple treatment at home, including Tylenol.  Minimal use NSAID due to HTN.  Apply ice as needed and gentle stretching.      Relevant Orders   Ambulatory referral to Orthopedics     Other   Elevated LDL cholesterol level    Continue diet focus at this time, will recheck at physical in July.         I discussed the assessment and treatment plan with the patient. The patient was provided an opportunity to ask questions and all were answered. The patient agreed with the plan and demonstrated an understanding of the instructions.   The patient was advised to call back or seek an in-person evaluation if the symptoms worsen or if the condition fails to improve as anticipated.   I provided 15 minutes of time during this encounter.  Follow up plan: Return in about 5 months (around 01/03/2020) for Annual physical.

## 2019-08-06 NOTE — Telephone Encounter (Signed)
Message sent through [place], pt received

## 2019-08-06 NOTE — Assessment & Plan Note (Signed)
Referral to ortho for hand specialist in [place].  Recommend continued simple treatment at home, including Tylenol.  Minimal use NSAID due to HTN.  Apply ice as needed and gentle stretching.

## 2019-08-06 NOTE — Telephone Encounter (Signed)
-----   Message from Venita Lick, NP sent at 08/06/2019  9:54 AM EST ----- 5 months (July) annual physical

## 2019-08-06 NOTE — Assessment & Plan Note (Signed)
Chronic and stable.  Continue current medication regimen and adjust as needed.  Recommend he check BP at home at least three mornings a week.  Refill sent.  Obtain labs at physical in July.

## 2019-08-10 NOTE — Telephone Encounter (Signed)
LVM for pt to call back to schedule appt

## 2019-08-11 ENCOUNTER — Other Ambulatory Visit: Payer: Self-pay | Admitting: Nurse Practitioner

## 2019-08-12 NOTE — Telephone Encounter (Signed)
Requested Prescriptions  Pending Prescriptions Disp Refills  . tadalafil (CIALIS) 20 MG tablet [Pharmacy Med Name: TADALAFIL 20 MG TABLET] 4 tablet 0    Sig: TAKE 1 TABLET BY MOUTH EVERY DAY AS NEEDED     Urology: Erectile Dysfunction Agents Failed - 08/11/2019  7:00 PM      Failed - Last BP in normal range    BP Readings from Last 1 Encounters:  11/19/17 (!) 152/89         Passed - Valid encounter within last 12 months    Recent Outpatient Visits          6 days ago Seizure disorder ([place])   [place] Cannady, [place] T, NP   8 months ago Seizure disorder ([place])   [place], Henrine Screws T, NP   1 year ago Routine general medical examination at a health care facility   [facility] Kathrine Haddock, NP   2 years ago Medication monitoring encounter   [place], NP   3 years ago Seizure disorder [facility])   [facility] Kathrine Haddock, NP

## 2019-08-18 DIAGNOSIS — M1812 Unilateral primary osteoarthritis of first carpometacarpal joint, left hand: Secondary | ICD-10-CM | POA: Diagnosis not present

## 2019-08-18 DIAGNOSIS — M25532 Pain in left wrist: Secondary | ICD-10-CM | POA: Diagnosis not present

## 2019-08-26 ENCOUNTER — Other Ambulatory Visit: Payer: Self-pay | Admitting: Nurse Practitioner

## 2019-09-02 ENCOUNTER — Other Ambulatory Visit: Payer: Self-pay | Admitting: Nurse Practitioner

## 2019-09-02 NOTE — Telephone Encounter (Signed)
Requested medication (s) are due for refill today: no  Requested medication (s) are on the active medication list: yes  Last refill:  08/26/19  Future visit scheduled: yes  Notes to clinic:  urology: Erectile Dysfunction Agents failed  Requested Prescriptions  Pending Prescriptions Disp Refills   tadalafil (CIALIS) 20 MG tablet [Pharmacy Med Name: TADALAFIL 20 MG TABLET] 4 tablet 0    Sig: TAKE 1 TABLET BY MOUTH EVERY DAY AS NEEDED      Urology: Erectile Dysfunction Agents Failed - 09/02/2019  6:00 PM      Failed - Last BP in normal range    BP Readings from Last 1 Encounters:  11/19/17 (!) 152/89          Passed - Valid encounter within last 12 months    Recent Outpatient Visits           3 weeks ago Seizure disorder ([place])   [place] Cannady, [place] T, NP   9 months ago Seizure disorder ([place])   [place], Henrine Screws T, NP   1 year ago Routine general medical examination at a health care facility   [facility] Kathrine Haddock, NP   2 years ago Medication monitoring encounter   [place], NP   3 years ago Seizure disorder [facility])   [place] Kathrine Haddock, NP       Future Appointments             In 3 months Cannady, Barbaraann Faster, NP [facility]

## 2019-09-03 NOTE — Telephone Encounter (Signed)
Routing to provider  

## 2019-10-22 ENCOUNTER — Other Ambulatory Visit: Payer: Self-pay | Admitting: Nurse Practitioner

## 2019-10-22 NOTE — Telephone Encounter (Signed)
Requested Prescriptions  Pending Prescriptions Disp Refills  . tadalafil (CIALIS) 20 MG tablet [Pharmacy Med Name: TADALAFIL 20 MG TABLET] 4 tablet 3    Sig: TAKE 1 TABLET BY MOUTH EVERY DAY AS NEEDED     Urology: Erectile Dysfunction Agents Failed - 10/22/2019  5:26 PM      Failed - Last BP in normal range    BP Readings from Last 1 Encounters:  11/19/17 (!) 152/89         Passed - Valid encounter within last 12 months    Recent Outpatient Visits          2 months ago Seizure disorder ([place])   [place] Cannady, [place] T, NP   10 months ago Seizure disorder ([place])   [place], Henrine Screws T, NP   1 year ago Routine general medical examination at a health care facility   [facility] Kathrine Haddock, NP   2 years ago Medication monitoring encounter   [place], NP   3 years ago Seizure disorder [facility]   [place] Kathrine Haddock, NP      Future Appointments            In 1 month Cannady, Barbaraann Faster, NP [facility]

## 2019-11-16 DIAGNOSIS — H0012 Chalazion right lower eyelid: Secondary | ICD-10-CM | POA: Diagnosis not present

## 2019-12-04 ENCOUNTER — Ambulatory Visit (INDEPENDENT_AMBULATORY_CARE_PROVIDER_SITE_OTHER): Payer: BC Managed Care – PPO | Admitting: Nurse Practitioner

## 2019-12-04 ENCOUNTER — Encounter: Payer: Self-pay | Admitting: Nurse Practitioner

## 2019-12-04 ENCOUNTER — Other Ambulatory Visit: Payer: Self-pay

## 2019-12-04 VITALS — BP 128/82 | HR 71 | Temp 98.1°F | Ht 68.6 in | Wt 198.0 lb

## 2019-12-04 DIAGNOSIS — Z5181 Encounter for therapeutic drug level monitoring: Secondary | ICD-10-CM | POA: Diagnosis not present

## 2019-12-04 DIAGNOSIS — Z0000 Encounter for general adult medical examination without abnormal findings: Secondary | ICD-10-CM | POA: Diagnosis not present

## 2019-12-04 DIAGNOSIS — Z23 Encounter for immunization: Secondary | ICD-10-CM | POA: Diagnosis not present

## 2019-12-04 DIAGNOSIS — I10 Essential (primary) hypertension: Secondary | ICD-10-CM | POA: Diagnosis not present

## 2019-12-04 DIAGNOSIS — E7800 Pure hypercholesterolemia, unspecified: Secondary | ICD-10-CM

## 2019-12-04 DIAGNOSIS — Z1159 Encounter for screening for other viral diseases: Secondary | ICD-10-CM | POA: Diagnosis not present

## 2019-12-04 DIAGNOSIS — Z114 Encounter for screening for human immunodeficiency virus [HIV]: Secondary | ICD-10-CM

## 2019-12-04 DIAGNOSIS — G40909 Epilepsy, unspecified, not intractable, without status epilepticus: Secondary | ICD-10-CM

## 2019-12-04 DIAGNOSIS — Z1211 Encounter for screening for malignant neoplasm of colon: Secondary | ICD-10-CM

## 2019-12-04 DIAGNOSIS — Z125 Encounter for screening for malignant neoplasm of prostate: Secondary | ICD-10-CM | POA: Diagnosis not present

## 2019-12-04 NOTE — Assessment & Plan Note (Signed)
Chronic, stable with no seizure since 1991.  Continue current medication regimen and check phenobarbital level today.  Referral to neuro if any worsening seizures present.  Return in 6 months.

## 2019-12-04 NOTE — Assessment & Plan Note (Signed)
Chronic, ongoing with initial BP elevated, but repeat improved.  Continue current medication regimen and adjust as needed.  Recommend he check BP at home at least three mornings a week.  CMP, TSH, CBC today.  Focus on DASH diet at home.  Return in 6 months.

## 2019-12-04 NOTE — Progress Notes (Signed)
BP 128/82 (BP Location: Left Arm)   Pulse 71   Temp 98.1 F (36.7 C) (Oral)   Ht 5' 8.6" (1.742 m)   Wt 198 lb (89.8 kg)   SpO2 95%   BMI 29.58 kg/m    Subjective:    Patient ID: Dillon Blake, male    DOB: May 31, 1966, 54 y.o.   MRN: [id number]  HPI: Dillon Blake is a 54 y.o. male presenting on 12/04/2019 for comprehensive medical examination. Current medical complaints include:none  He currently lives with: girlfriend Interim Problems from his last visit: no   HYPERTENSION Continues on Lisinopril 5 MG daily.  LDL was elevated on recent labs, 129. Hypertension status:controlled Satisfied with current treatment?yes Duration of hypertension:chronic BP monitoring frequency:not checking BP range: BP medication side effects:no Medication compliance:good compliance Aspirin:no Recurrent headaches:no Visual changes:no Palpitations:no Dyspnea:no Chest pain:no Lower extremity edema:no Dizzy/lightheaded:no The 10-year ASCVD risk score Mikey Bussing DC Jr., et al., 2013) is: 5.2%   Values used to calculate the score:     Age: 52 years     Sex: Male     Is Non-Hispanic African American: No     Diabetic: No     Tobacco smoker: No     Systolic Blood Pressure: 630 mmHg     Is BP treated: Yes     HDL Cholesterol: 54 mg/dL     Total Cholesterol: 222 mg/dL   SEIZURE DISORDER: Last phenobarbital level was 01/23/2019 = 15. Continues on 97.2 MG at bedtime, whch is dose he has been on for many years since diagnosis. Last seizure was in April 1991, first one was June of 1987. Has only had two seizures in his life. Did see neurology years ago and they did sleep testing, he reports they found that when he had lack of sleep this led to seizures. So he stays on adequate sleep regimen at home.  PMP review last fill 11/09/2019, no other controlled substances noted.   Functional Status Survey: Is the patient deaf or have difficulty hearing?: No Does the patient have  difficulty seeing, even when wearing glasses/contacts?: No Does the patient have difficulty concentrating, remembering, or making decisions?: No Does the patient have difficulty walking or climbing stairs?: No Does the patient have difficulty dressing or bathing?: No Does the patient have difficulty doing errands alone such as visiting a doctor's office or shopping?: No  FALL RISK: Fall Risk  08/06/2019 11/19/2017 05/22/2017  Falls in the past year? 0 No No  Number falls in past yr: 0 - -  Injury with Fall? 0 - -  Follow up Falls evaluation completed - -    Depression Screen Depression screen [facility] 2/9 12/04/2019 08/06/2019 11/19/2017 05/22/2017  Decreased Interest 0 0 0 0  Down, Depressed, Hopeless 0 0 0 0  PHQ - 2 Score 0 0 0 0  Altered sleeping 1 - - -  Tired, decreased energy 0 - - -  Change in appetite 1 - - -  Feeling bad or failure about yourself  0 - - -  Trouble concentrating 0 - - -  Moving slowly or fidgety/restless 0 - - -  Suicidal thoughts 0 - - -  PHQ-9 Score 2 - - -  Difficult doing work/chores Not difficult at all - - -    Advanced Directives <no information>  Past Medical History:  Past Medical History:  Diagnosis Date  . Allergy   . ED (erectile dysfunction)   . Impotence, organic   . Seizure disorder (  [facility])     Surgical History:  Past Surgical History:  Procedure Laterality Date  . TONSILLECTOMY  1972    Medications:  Current Outpatient Medications on File Prior to Visit  Medication Sig  . lisinopril (ZESTRIL) 5 MG tablet Take 1 tablet (5 mg total) by mouth daily. Needs follow up appointment  . PHENobarbital (LUMINAL) 97.2 MG tablet Take one tablet by mouth every day at bedtime.  . tadalafil (CIALIS) 20 MG tablet TAKE 1 TABLET BY MOUTH EVERY DAY AS NEEDED   No current facility-administered medications on file prior to visit.    Allergies:  Allergies  Allergen Reactions  . Penicillin G Benzathine     Social History:  Social History    Socioeconomic History  . Marital status: Unknown    Spouse name: Not on file  . Number of children: Not on file  . Years of education: Not on file  . Highest education level: Not on file  Occupational History  . Not on file  Tobacco Use  . Smoking status: Never Smoker  . Smokeless tobacco: Never Used  Vaping Use  . Vaping Use: Never used  Substance and Sexual Activity  . Alcohol use: Yes    Alcohol/week: 3.0 - 4.0 standard drinks    Types: 3 - 4 Cans of beer per week  . Drug use: No  . Sexual activity: Yes  Other Topics Concern  . Not on file  Social History Narrative  . Not on file   Social Determinants of Health   Financial Resource Strain:   . Difficulty of Paying Living Expenses:   Food Insecurity:   . Worried About Charity fundraiser in the Last Year:   . Arboriculturist in the Last Year:   Transportation Needs:   . Film/video editor (Medical):   Marland Kitchen Lack of Transportation (Non-Medical):   Physical Activity:   . Days of Exercise per Week:   . Minutes of Exercise per Session:   Stress:   . Feeling of Stress :   Social Connections:   . Frequency of Communication with Friends and Family:   . Frequency of Social Gatherings with Friends and Family:   . Attends Religious Services:   . Active Member of Clubs or Organizations:   . Attends Archivist Meetings:   Marland Kitchen Marital Status:   Intimate Partner Violence:   . Fear of Current or Ex-Partner:   . Emotionally Abused:   Marland Kitchen Physically Abused:   . Sexually Abused:    Social History   Tobacco Use  Smoking Status Never Smoker  Smokeless Tobacco Never Used   Social History   Substance and Sexual Activity  Alcohol Use Yes  . Alcohol/week: 3.0 - 4.0 standard drinks  . Types: 3 - 4 Cans of beer per week    Family History:  Family History  Problem Relation Age of Onset  . Arthritis Mother   . Lupus Mother   . Seizures Mother   . Allergies Mother   . Bursitis Mother   . Cancer Mother         leukemia  . Hypertension Father   . Diabetes Maternal Grandmother   . Stroke Maternal Grandmother   . Cancer Maternal Grandfather        bile duct  . Heart disease Paternal Grandmother        MI  . Cancer Paternal Grandfather        lung    Past medical history, surgical  history, medications, allergies, family history and social history reviewed with patient today and changes made to appropriate areas of the chart.   Review of Systems - negative All other ROS negative except what is listed above and in the HPI.      Objective:    BP 128/82 (BP Location: Left Arm)   Pulse 71   Temp 98.1 F (36.7 C) (Oral)   Ht 5' 8.6" (1.742 m)   Wt 198 lb (89.8 kg)   SpO2 95%   BMI 29.58 kg/m   Wt Readings from Last 3 Encounters:  12/04/19 198 lb (89.8 kg)  11/28/18 186 lb 6.4 oz (84.6 kg)  11/19/17 186 lb 3.2 oz (84.5 kg)    Physical Exam Vitals and nursing note reviewed.  Constitutional:      General: He is awake. He is not in acute distress.    Appearance: He is well-developed and well-groomed. He is not ill-appearing.  HENT:     Head: Normocephalic and atraumatic.     Right Ear: Hearing, tympanic membrane, ear canal and external ear normal. No drainage.     Left Ear: Hearing, tympanic membrane, ear canal and external ear normal. No drainage.     Nose: Nose normal.     Mouth/Throat:     Pharynx: Uvula midline.  Eyes:     General: Lids are normal.        Right eye: No discharge.        Left eye: No discharge.     Extraocular Movements: Extraocular movements intact.     Conjunctiva/sclera: Conjunctivae normal.     Pupils: Pupils are equal, round, and reactive to light.     Visual Fields: Right eye visual fields normal and left eye visual fields normal.  Neck:     Thyroid: No thyromegaly.     Vascular: No carotid bruit or JVD.     Trachea: Trachea normal.  Cardiovascular:     Rate and Rhythm: Normal rate and regular rhythm.     Heart sounds: Normal heart sounds, S1 normal  and S2 normal. No murmur heard.  No gallop.   Pulmonary:     Effort: Pulmonary effort is normal. No accessory muscle usage or respiratory distress.     Breath sounds: Normal breath sounds.  Abdominal:     General: Bowel sounds are normal.     Palpations: Abdomen is soft. There is no hepatomegaly or splenomegaly.     Tenderness: There is no abdominal tenderness.  Musculoskeletal:        General: Normal range of motion.     Cervical back: Normal range of motion and neck supple.     Right lower leg: No edema.     Left lower leg: No edema.  Lymphadenopathy:     Head:     Right side of head: No submental, submandibular, tonsillar, preauricular or posterior auricular adenopathy.     Left side of head: No submental, submandibular, tonsillar, preauricular or posterior auricular adenopathy.     Cervical: No cervical adenopathy.  Skin:    General: Skin is warm and dry.     Capillary Refill: Capillary refill takes less than 2 seconds.     Findings: No rash.  Neurological:     Mental Status: He is alert and oriented to person, place, and time.     Cranial Nerves: Cranial nerves are intact.     Gait: Gait is intact.     Deep Tendon Reflexes: Reflexes are normal and symmetric.  Reflex Scores:      Brachioradialis reflexes are 2+ on the right side and 2+ on the left side.      Patellar reflexes are 2+ on the right side and 2+ on the left side. Psychiatric:        Attention and Perception: Attention normal.        Mood and Affect: Mood normal.        Speech: Speech normal.        Behavior: Behavior normal. Behavior is cooperative.        Thought Content: Thought content normal.        Cognition and Memory: Cognition normal.        Judgment: Judgment normal.    Results for orders placed or performed in visit on 01/23/19  Phenobarbital level  Result Value Ref Range   Phenobarbital, Serum 15 15 - 40 ug/mL  Lipid Panel w/o Chol/HDL Ratio  Result Value Ref Range   Cholesterol, Total 222  (H) 100 - 199 mg/dL   Triglycerides 195 (H) 0 - 149 mg/dL   HDL 54 >39 mg/dL   VLDL Cholesterol Cal 39 5 - 40 mg/dL   LDL Calculated 129 (H) 0 - 99 mg/dL  CBC with Differential/Platelet  Result Value Ref Range   WBC 6.9 3.4 - 10.8 x10E3/uL   RBC 5.37 4.14 - 5.80 x10E6/uL   Hemoglobin 16.9 13.0 - 17.7 g/dL   Hematocrit 47.6 37.5 - 51.0 %   MCV 89 79 - 97 fL   MCH 31.5 26.6 - 33.0 pg   MCHC 35.5 31 - 35 g/dL   RDW 12.4 11.6 - 15.4 %   Platelets 250 150 - 450 x10E3/uL   Neutrophils 58 Not Estab. %   Lymphs 33 Not Estab. %   Monocytes 7 Not Estab. %   Eos 1 Not Estab. %   Basos 1 Not Estab. %   Neutrophils Absolute 3.9 1 - 7 x10E3/uL   Lymphocytes Absolute 2.3 0 - 3 x10E3/uL   Monocytes Absolute 0.5 0 - 0 x10E3/uL   EOS (ABSOLUTE) 0.1 0.0 - 0.4 x10E3/uL   Basophils Absolute 0.1 0 - 0 x10E3/uL   Immature Granulocytes 0 Not Estab. %   Immature Grans (Abs) 0.0 0.0 - 0.1 x10E3/uL  Comprehensive metabolic panel  Result Value Ref Range   Glucose 81 65 - 99 mg/dL   BUN 13 6 - 24 mg/dL   Creatinine, Ser 1.00 0.76 - 1.27 mg/dL   GFR calc non Af Amer 87 >59 mL/min/1.73   GFR calc Af Amer 100 >59 mL/min/1.73   BUN/Creatinine Ratio 13 9 - 20   Sodium 143 134 - 144 mmol/L   Potassium 4.4 3.5 - 5.2 mmol/L   Chloride 102 96 - 106 mmol/L   CO2 24 20 - 29 mmol/L   Calcium 9.4 8.7 - 10.2 mg/dL   Total Protein 7.6 6.0 - 8.5 g/dL   Albumin 4.9 3.8 - 4.9 g/dL   Globulin, Total 2.7 1.5 - 4.5 g/dL   Albumin/Globulin Ratio 1.8 1.2 - 2.2   Bilirubin Total 0.4 0.0 - 1.2 mg/dL   Alkaline Phosphatase 79 39 - 117 IU/L   AST 28 0 - 40 IU/L   ALT 44 0 - 44 IU/L  TSH  Result Value Ref Range   TSH 1.220 0.450 - 4.500 uIU/mL  PSA  Result Value Ref Range   Prostate Specific Ag, Serum 0.9 0.0 - 4.0 ng/mL      Assessment & Plan:  Problem List Items Addressed This Visit      Cardiovascular and Mediastinum   Hypertension    Chronic, ongoing with initial BP elevated, but repeat improved.   Continue current medication regimen and adjust as needed.  Recommend he check BP at home at least three mornings a week.  CMP, TSH, CBC today.  Focus on DASH diet at home.  Return in 6 months.      Relevant Orders   CBC with Differential/Platelet   TSH     Nervous and Auditory   Seizure disorder (HCC)    Chronic, stable with no seizure since 1991.  Continue current medication regimen and check phenobarbital level today.  Referral to neuro if any worsening seizures present.  Return in 6 months.      Relevant Orders   Comprehensive metabolic panel   Phenobarbital level     Other   Medication monitoring encounter    Check Phenobarbital level      Elevated LDL cholesterol level    Continue diet focus at this time, ASCVD 5.2%, recheck lipid panel today.      Relevant Orders   Lipid Panel w/o Chol/HDL Ratio    Other Visit Diagnoses    Encounter for annual physical exam    -  Primary   Encounter for screening for HIV       HIV on labs today   Relevant Orders   HIV Antibody (routine testing w rflx)   Prostate cancer screening       PSA on labs today   Relevant Orders   PSA   Need for hepatitis C screening test       Hep C screen on labs today   Relevant Orders   Hepatitis C antibody   Need for diphtheria-tetanus-pertussis (Tdap) vaccine       Relevant Orders   Tdap vaccine greater than or equal to 7yo IM (Completed)   Colon cancer screening       Cologuard ordered   Relevant Orders   Cologuard       Discussed aspirin prophylaxis for myocardial infarction prevention and decision was it was not indicated  LABORATORY TESTING:  Health maintenance labs ordered today as discussed above.   The natural history of prostate cancer and ongoing controversy regarding screening and potential treatment outcomes of prostate cancer has been discussed with the patient. The meaning of a false positive PSA and a false negative PSA has been discussed. He indicates understanding of the  limitations of this screening test and wishes  to proceed with screening PSA testing.   IMMUNIZATIONS:   - Tdap: Tetanus vaccination status reviewed: last tetanus booster within 10 years. - Influenza: Up to date - Pneumovax: Not applicable - Prevnar: Not applicable - Zostavax vaccine: Refused  SCREENING: - Colonoscopy: Cologuard  Discussed with patient purpose of the colonoscopy is to detect colon cancer at curable precancerous or early stages   - AAA Screening: Not applicable  -Hearing Test: Not applicable  -Spirometry: Not applicable   PATIENT COUNSELING:    Sexuality: Discussed sexually transmitted diseases, partner selection, use of condoms, avoidance of unintended pregnancy  and contraceptive alternatives.   Advised to avoid cigarette smoking.  I discussed with the patient that most people either abstain from alcohol or drink within safe limits (<=14/week and <=4 drinks/occasion for males, <=7/weeks and <= 3 drinks/occasion for females) and that the risk for alcohol disorders and other health effects rises proportionally with the number of drinks per week and how often  a drinker exceeds daily limits.  Discussed cessation/primary prevention of drug use and availability of treatment for abuse.   Diet: Encouraged to adjust caloric intake to maintain  or achieve ideal body weight, to reduce intake of dietary saturated fat and total fat, to limit sodium intake by avoiding high sodium foods and not adding table salt, and to maintain adequate dietary potassium and calcium preferably from fresh fruits, vegetables, and low-fat dairy products.    stressed the importance of regular exercise  Injury prevention: Discussed safety belts, safety helmets, smoke detector, smoking near bedding or upholstery.   Dental health: Discussed importance of regular tooth brushing, flossing, and dental visits.   Follow up plan: NEXT PREVENTATIVE PHYSICAL DUE IN 1 YEAR. Return in about 6 months (around  06/04/2020) for HTN/HLD, Seizures.

## 2019-12-04 NOTE — Assessment & Plan Note (Signed)
Continue diet focus at this time, ASCVD 5.2%, recheck lipid panel today.

## 2019-12-04 NOTE — Patient Instructions (Signed)
DASH Eating Plan DASH stands for "Dietary Approaches to Stop Hypertension." The DASH eating plan is a healthy eating plan that has been shown to reduce high blood pressure (hypertension). It may also reduce your risk for type 2 diabetes, heart disease, and stroke. The DASH eating plan may also help with weight loss. What are tips for following this plan?  General guidelines  Avoid eating more than 2,300 mg (milligrams) of salt (sodium) a day. If you have hypertension, you may need to reduce your sodium intake to 1,500 mg a day.  Limit alcohol intake to no more than 1 drink a day for nonpregnant women and 2 drinks a day for men. One drink equals 12 oz of beer, 5 oz of wine, or 1 oz of hard liquor.  Work with your health care provider to maintain a healthy body weight or to lose weight. Ask what an ideal weight is for you.  Get at least 30 minutes of exercise that causes your heart to beat faster (aerobic exercise) most days of the week. Activities may include walking, swimming, or biking.  Work with your health care provider or diet and nutrition specialist (dietitian) to adjust your eating plan to your individual calorie needs. Reading food labels   Check food labels for the amount of sodium per serving. Choose foods with less than 5 percent of the Daily Value of sodium. Generally, foods with less than 300 mg of sodium per serving fit into this eating plan.  To find whole grains, look for the word "whole" as the first word in the ingredient list. Shopping  Buy products labeled as "low-sodium" or "no salt added."  Buy fresh foods. Avoid canned foods and premade or frozen meals. Cooking  Avoid adding salt when cooking. Use salt-free seasonings or herbs instead of table salt or sea salt. Check with your health care provider or pharmacist before using salt substitutes.  Do not fry foods. Cook foods using healthy methods such as baking, boiling, grilling, and broiling instead.  Cook with  heart-healthy oils, such as olive, canola, soybean, or sunflower oil. Meal planning  Eat a balanced diet that includes: ? 5 or more servings of fruits and vegetables each day. At each meal, try to fill half of your plate with fruits and vegetables. ? Up to 6-8 servings of whole grains each day. ? Less than 6 oz of lean meat, poultry, or fish each day. A 3-oz serving of meat is about the same size as a deck of cards. One egg equals 1 oz. ? 2 servings of low-fat dairy each day. ? A serving of nuts, seeds, or beans 5 times each week. ? Heart-healthy fats. Healthy fats called Omega-3 fatty acids are found in foods such as flaxseeds and coldwater fish, like sardines, salmon, and mackerel.  Limit how much you eat of the following: ? Canned or prepackaged foods. ? Food that is high in trans fat, such as fried foods. ? Food that is high in saturated fat, such as fatty meat. ? Sweets, desserts, sugary drinks, and other foods with added sugar. ? Full-fat dairy products.  Do not salt foods before eating.  Try to eat at least 2 vegetarian meals each week.  Eat more home-cooked food and less restaurant, buffet, and fast food.  When eating at a restaurant, ask that your food be prepared with less salt or no salt, if possible. What foods are recommended? The items listed may not be a complete list. Talk with your dietitian about   what dietary choices are best for you. Grains Whole-grain or whole-wheat bread. Whole-grain or whole-wheat pasta. Brown rice. Oatmeal. Quinoa. Bulgur. Whole-grain and low-sodium cereals. Pita bread. Low-fat, low-sodium crackers. Whole-wheat flour tortillas. Vegetables Fresh or frozen vegetables (raw, steamed, roasted, or grilled). Low-sodium or reduced-sodium tomato and vegetable juice. Low-sodium or reduced-sodium tomato sauce and tomato paste. Low-sodium or reduced-sodium canned vegetables. Fruits All fresh, dried, or frozen fruit. Canned fruit in natural juice (without  added sugar). Meat and other protein foods Skinless chicken or turkey. Ground chicken or turkey. Pork with fat trimmed off. Fish and seafood. Egg whites. Dried beans, peas, or lentils. Unsalted nuts, nut butters, and seeds. Unsalted canned beans. Lean cuts of beef with fat trimmed off. Low-sodium, lean deli meat. Dairy Low-fat (1%) or fat-free (skim) milk. Fat-free, low-fat, or reduced-fat cheeses. Nonfat, low-sodium ricotta or cottage cheese. Low-fat or nonfat yogurt. Low-fat, low-sodium cheese. Fats and oils Soft margarine without trans fats. Vegetable oil. Low-fat, reduced-fat, or light mayonnaise and salad dressings (reduced-sodium). Canola, safflower, olive, soybean, and sunflower oils. Avocado. Seasoning and other foods Herbs. Spices. Seasoning mixes without salt. Unsalted popcorn and pretzels. Fat-free sweets. What foods are not recommended? The items listed may not be a complete list. Talk with your dietitian about what dietary choices are best for you. Grains Baked goods made with fat, such as croissants, muffins, or some breads. Dry pasta or rice meal packs. Vegetables Creamed or fried vegetables. Vegetables in a cheese sauce. Regular canned vegetables (not low-sodium or reduced-sodium). Regular canned tomato sauce and paste (not low-sodium or reduced-sodium). Regular tomato and vegetable juice (not low-sodium or reduced-sodium). Pickles. Olives. Fruits Canned fruit in a light or heavy syrup. Fried fruit. Fruit in cream or butter sauce. Meat and other protein foods Fatty cuts of meat. Ribs. Fried meat. Bacon. Sausage. Bologna and other processed lunch meats. Salami. Fatback. Hotdogs. Bratwurst. Salted nuts and seeds. Canned beans with added salt. Canned or smoked fish. Whole eggs or egg yolks. Chicken or turkey with skin. Dairy Whole or 2% milk, cream, and half-and-half. Whole or full-fat cream cheese. Whole-fat or sweetened yogurt. Full-fat cheese. Nondairy creamers. Whipped toppings.  Processed cheese and cheese spreads. Fats and oils Butter. Stick margarine. Lard. Shortening. Ghee. Bacon fat. Tropical oils, such as coconut, palm kernel, or palm oil. Seasoning and other foods Salted popcorn and pretzels. Onion salt, garlic salt, seasoned salt, table salt, and sea salt. Worcestershire sauce. Tartar sauce. Barbecue sauce. Teriyaki sauce. Soy sauce, including reduced-sodium. Steak sauce. Canned and packaged gravies. Fish sauce. Oyster sauce. Cocktail sauce. Horseradish that you find on the shelf. Ketchup. Mustard. Meat flavorings and tenderizers. Bouillon cubes. Hot sauce and Tabasco sauce. Premade or packaged marinades. Premade or packaged taco seasonings. Relishes. Regular salad dressings. Where to find more information:  National Heart, Lung, and Blood Institute: www.nhlbi.nih.gov  American Heart Association: www.heart.org Summary  The DASH eating plan is a healthy eating plan that has been shown to reduce high blood pressure (hypertension). It may also reduce your risk for type 2 diabetes, heart disease, and stroke.  With the DASH eating plan, you should limit salt (sodium) intake to 2,300 mg a day. If you have hypertension, you may need to reduce your sodium intake to 1,500 mg a day.  When on the DASH eating plan, aim to eat more fresh fruits and vegetables, whole grains, lean proteins, low-fat dairy, and heart-healthy fats.  Work with your health care provider or diet and nutrition specialist (dietitian) to adjust your eating plan to your   individual calorie needs. This information is not intended to replace advice given to you by your health care provider. Make sure you discuss any questions you have with your health care provider. Document Revised: 05/24/2017 Document Reviewed: 06/04/2016 Elsevier Patient Education  2020 [employer]  

## 2019-12-04 NOTE — Assessment & Plan Note (Signed)
Check Phenobarbital level

## 2019-12-05 LAB — COMPREHENSIVE METABOLIC PANEL
ALT: 33 IU/L (ref 0–44)
AST: 26 IU/L (ref 0–40)
Albumin/Globulin Ratio: 1.8 (ref 1.2–2.2)
Albumin: 4.8 g/dL (ref 3.8–4.9)
Alkaline Phosphatase: 83 IU/L (ref 48–121)
BUN/Creatinine Ratio: 18 (ref 9–20)
BUN: 19 mg/dL (ref 6–24)
Bilirubin Total: 0.3 mg/dL (ref 0.0–1.2)
CO2: 26 mmol/L (ref 20–29)
Calcium: 9.9 mg/dL (ref 8.7–10.2)
Chloride: 98 mmol/L (ref 96–106)
Creatinine, Ser: 1.04 mg/dL (ref 0.76–1.27)
GFR calc Af Amer: 95 mL/min/{1.73_m2} (ref >59)
GFR calc non Af Amer: 82 mL/min/{1.73_m2} (ref >59)
Globulin, Total: 2.6 g/dL (ref 1.5–4.5)
Glucose: 86 mg/dL (ref 65–99)
Potassium: 4.3 mmol/L (ref 3.5–5.2)
Sodium: 137 mmol/L (ref 134–144)
Total Protein: 7.4 g/dL (ref 6.0–8.5)

## 2019-12-05 LAB — HIV ANTIBODY (ROUTINE TESTING W REFLEX): HIV Screen 4th Generation wRfx: NONREACTIVE

## 2019-12-05 LAB — PHENOBARBITAL LEVEL: Phenobarbital, Serum: 14 ug/mL — ABNORMAL LOW (ref 15–40)

## 2019-12-05 LAB — CBC WITH DIFFERENTIAL/PLATELET
Basophils Absolute: 0.1 10*3/uL (ref 0.0–0.2)
Basos: 1 %
EOS (ABSOLUTE): 0.1 10*3/uL (ref 0.0–0.4)
Eos: 2 %
Hematocrit: 47 % (ref 37.5–51.0)
Hemoglobin: 16.4 g/dL (ref 13.0–17.7)
Immature Grans (Abs): 0 10*3/uL (ref 0.0–0.1)
Immature Granulocytes: 0 %
Lymphocytes Absolute: 2.6 10*3/uL (ref 0.7–3.1)
Lymphs: 36 %
MCH: 32.3 pg (ref 26.6–33.0)
MCHC: 34.9 g/dL (ref 31.5–35.7)
MCV: 93 fL (ref 79–97)
Monocytes Absolute: 0.6 10*3/uL (ref 0.1–0.9)
Monocytes: 8 %
Neutrophils Absolute: 3.8 10*3/uL (ref 1.4–7.0)
Neutrophils: 53 %
Platelets: 248 10*3/uL (ref 150–450)
RBC: 5.07 x10E6/uL (ref 4.14–5.80)
RDW: 12.5 % (ref 11.6–15.4)
WBC: 7.2 10*3/uL (ref 3.4–10.8)

## 2019-12-05 LAB — LIPID PANEL W/O CHOL/HDL RATIO
Cholesterol, Total: 250 mg/dL — ABNORMAL HIGH (ref 100–199)
HDL: 46 mg/dL (ref >39)
LDL Chol Calc (NIH): 124 mg/dL — ABNORMAL HIGH (ref 0–99)
Triglycerides: 448 mg/dL — ABNORMAL HIGH (ref 0–149)
VLDL Cholesterol Cal: 80 mg/dL — ABNORMAL HIGH (ref 5–40)

## 2019-12-05 LAB — HEPATITIS C ANTIBODY: Hep C Virus Ab: <0.1 s/co ratio (ref 0.0–0.9)

## 2019-12-05 LAB — TSH: TSH: 1.47 u[IU]/mL (ref 0.450–4.500)

## 2019-12-05 LAB — PSA: Prostate Specific Ag, Serum: 0.9 ng/mL (ref 0.0–4.0)

## 2019-12-06 NOTE — Progress Notes (Signed)
Contacted via [place] evening [place], your labs have returned.  Overall everything looks great with exception of: - Phenobarbital level is a little low, please ensure you are taking this daily.  We will maintain dose as you have been seizure free for some time on this dose and the big thing we look for is to ensure this level is not too high.   - Your cholesterol is still high, but continued recommendations to make lifestyle changes. Your LDL is above normal. The LDL is the bad cholesterol. Over time and in combination with inflammation and other factors, this contributes to plaque which in turn may lead to stroke and/or heart attack down the road. Sometimes high LDL is primarily genetic, and people might be eating all the right foods but still have high numbers. Other times, there is room for improvement in one's diet and eating healthier can bring this number down and potentially reduce one's risk of heart attack and/or stroke.   To reduce your LDL, Remember - more fruits and vegetables, more fish, and limit red meat and dairy products. More soy, nuts, beans, barley, lentils, oats and plant sterol ester enriched margarine instead of butter. I also encourage eliminating sugar and processed food. Remember, shop on the outside of the grocery store and visit your [employer]. If you would like to talk with me about dietary changes for your cholesterol, please let me know. We should recheck your cholesterol in 6 months.  Any questions? Keep being awesome!! Kindest regards, Gianelle Mccaul

## 2019-12-09 ENCOUNTER — Other Ambulatory Visit: Payer: Self-pay | Admitting: Nurse Practitioner

## 2019-12-17 ENCOUNTER — Other Ambulatory Visit: Payer: Self-pay | Admitting: Nurse Practitioner

## 2020-01-28 ENCOUNTER — Other Ambulatory Visit: Payer: Self-pay | Admitting: Nurse Practitioner

## 2020-02-05 ENCOUNTER — Other Ambulatory Visit: Payer: Self-pay | Admitting: Nurse Practitioner

## 2020-02-08 ENCOUNTER — Other Ambulatory Visit: Payer: Self-pay | Admitting: Nurse Practitioner

## 2020-02-08 NOTE — Telephone Encounter (Signed)
Requested medication (s) are due for refill today: No  Requested medication (s) are on the active medication list: yes  Last refill:  02/05/20 #90 1 refill (print)  Future visit scheduled: yes  Notes to clinic: no result for phenobarb lab.  Not delegated    Requested Prescriptions  Pending Prescriptions Disp Refills   PHENobarbital (LUMINAL) 97.2 MG tablet [Pharmacy Med Name: PHENOBARBITAL 97.2 MG TABLET] 90 tablet     Sig: TAKE 1 TABLET BY MOUTH EVERYDAY AT BEDTIME      Not Delegated - Neurology: Anticonvulsants - Controlled - phenobarbital Failed - 02/08/2020  9:46 AM      Failed - This refill cannot be delegated      Failed - Phenobarbital in normal range and within 360 days    No results found for: [place], [place] encounter within last 12 months    Recent Outpatient Visits           2 months ago Encounter for annual physical exam   [place], Henrine Screws T, NP   6 months ago Seizure disorder ([place])   [place] Cannady, Barbaraann Faster, NP   1 year ago Seizure disorder ([place])   [place], Henrine Screws T, NP   2 years ago Routine general medical examination at a health care facility   [place], NP   2 years ago Medication monitoring encounter   [facility] Kathrine Haddock, NP       Future Appointments             In 4 months Cannady, Barbaraann Faster, NP [employer]

## 2020-02-09 DIAGNOSIS — H40053 Ocular hypertension, bilateral: Secondary | ICD-10-CM | POA: Diagnosis not present

## 2020-02-09 DIAGNOSIS — H0014 Chalazion left upper eyelid: Secondary | ICD-10-CM | POA: Diagnosis not present

## 2020-02-09 DIAGNOSIS — H40003 Preglaucoma, unspecified, bilateral: Secondary | ICD-10-CM | POA: Diagnosis not present

## 2020-02-25 DIAGNOSIS — H0011 Chalazion right upper eyelid: Secondary | ICD-10-CM | POA: Diagnosis not present

## 2020-03-08 ENCOUNTER — Other Ambulatory Visit: Payer: Self-pay | Admitting: Nurse Practitioner

## 2020-03-19 DIAGNOSIS — Z20828 Contact with and (suspected) exposure to other viral communicable diseases: Secondary | ICD-10-CM | POA: Diagnosis not present

## 2020-03-22 DIAGNOSIS — Z20828 Contact with and (suspected) exposure to other viral communicable diseases: Secondary | ICD-10-CM | POA: Diagnosis not present

## 2020-03-25 DIAGNOSIS — Z20828 Contact with and (suspected) exposure to other viral communicable diseases: Secondary | ICD-10-CM | POA: Diagnosis not present

## 2020-04-21 ENCOUNTER — Other Ambulatory Visit: Payer: Self-pay | Admitting: Nurse Practitioner

## 2020-05-25 ENCOUNTER — Other Ambulatory Visit: Payer: Self-pay | Admitting: Nurse Practitioner

## 2020-06-10 ENCOUNTER — Ambulatory Visit: Payer: BC Managed Care – PPO | Admitting: Nurse Practitioner

## 2020-06-21 ENCOUNTER — Other Ambulatory Visit: Payer: Self-pay | Admitting: Nurse Practitioner

## 2020-06-26 DIAGNOSIS — Z20822 Contact with and (suspected) exposure to covid-19: Secondary | ICD-10-CM | POA: Diagnosis not present

## 2020-07-14 ENCOUNTER — Ambulatory Visit
Admission: EM | Admit: 2020-07-14 | Discharge: 2020-07-14 | Disposition: A | Discharge status: Home / Self Care | Payer: BC Managed Care – PPO | Attending: Internal Medicine | Admitting: Internal Medicine

## 2020-07-14 ENCOUNTER — Other Ambulatory Visit: Payer: Self-pay

## 2020-07-14 ENCOUNTER — Ambulatory Visit (INDEPENDENT_AMBULATORY_CARE_PROVIDER_SITE_OTHER): Payer: BC Managed Care – PPO

## 2020-07-14 ENCOUNTER — Encounter: Payer: Self-pay | Admitting: Emergency Medicine

## 2020-07-14 DIAGNOSIS — R079 Chest pain, unspecified: Secondary | ICD-10-CM | POA: Diagnosis not present

## 2020-07-14 DIAGNOSIS — R0781 Pleurodynia: Secondary | ICD-10-CM | POA: Diagnosis not present

## 2020-07-14 DIAGNOSIS — S2341XA Sprain of ribs, initial encounter: Secondary | ICD-10-CM | POA: Diagnosis not present

## 2020-07-14 LAB — URINALYSIS, COMPLETE (UACMP) WITH MICROSCOPIC
Bacteria, UA: NONE SEEN
Bilirubin Urine: NEGATIVE
Glucose, UA: NEGATIVE mg/dL
Hgb urine dipstick: NEGATIVE
Ketones, ur: NEGATIVE mg/dL
Leukocytes,Ua: NEGATIVE
Nitrite: NEGATIVE
Protein, ur: NEGATIVE mg/dL
Specific Gravity, Urine: 1.015 (ref 1.005–1.030)
Squamous Epithelial / HPF: NONE SEEN (ref 0–5)
pH: 6.5 (ref 5.0–8.0)

## 2020-07-14 MED ORDER — HYDROCODONE-ACETAMINOPHEN 5-325 MG PO TABS: 2.0000 | ORAL_TABLET | ORAL | 0 refills | Status: DC | PRN

## 2020-07-14 MED ORDER — METHOCARBAMOL 500 MG PO TABS: 500.0000 mg | ORAL_TABLET | Freq: Two times a day (BID) | ORAL | 0 refills | Status: DC

## 2020-07-14 NOTE — Discharge Instructions (Addendum)
Your rib xray and lung xray are normal. Your urine test is normal.  You have strained your rib and muscle area. I will have you use ice for 20 minutes 3-5 times a day for 48 hours, then alternate with heat after that.  I will have you try some muscle relaxer's. You cant take any anti-inflammatory medications since it interacts with your phenobarbital. So try some Norco for pain as needed.

## 2020-07-14 NOTE — ED Triage Notes (Signed)
Pt states that he is having left side pain that started last night. Pt states that with any movement he feels a pain shoot through his side.

## 2020-07-14 NOTE — ED Provider Notes (Signed)
[facility]    CSN: [id number] Arrival date & time: 07/14/20  1137      History   Chief Complaint Chief Complaint  Patient presents with  . Flank Pain    HPI Dillon Blake is a 55 y.o. male who presents with L lateral lower rib pain since yesterday pm. He was sitting on the couch and turned to this R to look at his girlfriend, and when he turned back to his L, felt the sharp pain on his L lateral rib. Has hx of renal stones and does not feel that way. Feels more as when he bruised his ribs in he past. Denies anything physical to injure himself. He does admits of having a violent cough in the am's to clear his sinuses, and did it this am and has more severe pain. Felt like a pop on this area. Denies change in his urine coloration. Pain is provoked with deep breaths, cough or thoracic movement. Took Tylenol 1000 mg at 9 am today and has not helped.     Past Medical History:  Diagnosis Date  . Allergy   . ED (erectile dysfunction)   . Impotence, organic   . Seizure disorder [facility])     Patient Active Problem List   Diagnosis Date Noted  . Tendinitis, de Quervain's 08/06/2019  . Elevated LDL cholesterol level 06/28/2019  . Medication monitoring encounter 05/22/2017  . Hypertension 05/09/2016  . Seizure disorder ([place]) 03/31/2015  . Allergic rhinitis 03/31/2015  . ED (erectile dysfunction) 03/31/2015    Past Surgical History:  Procedure Laterality Date  . TONSILLECTOMY  1972     Home Medications    Prior to Admission medications   Medication Sig Start Date End Date Taking? Authorizing Provider  HYDROcodone-acetaminophen (NORCO/VICODIN) 5-325 MG tablet Take 2 tablets by mouth every 4 (four) hours as needed. 07/14/20  Yes Rodriguez-Southworth, Sunday Spillers, PA-C  methocarbamol (ROBAXIN) 500 MG tablet Take 1 tablet (500 mg total) by mouth 2 (two) times daily. 07/14/20  Yes Rodriguez-Southworth, Sunday Spillers, PA-C  lisinopril (ZESTRIL) 5 MG tablet Take 1 tablet (5 mg total)  by mouth daily. Needs follow up appointment 08/06/19   Marnee Guarneri T, NP  PHENobarbital (LUMINAL) 97.2 MG tablet TAKE 1 TABLET BY MOUTH EVERYDAY AT BEDTIME 02/08/20   Cannady, Jolene T, NP  tadalafil (CIALIS) 20 MG tablet TAKE 1 TABLET BY MOUTH EVERY DAY AS NEEDED 06/21/20   Venita Lick, NP    Family History Family History  Problem Relation Age of Onset  . Arthritis Mother   . Lupus Mother   . Seizures Mother   . Allergies Mother   . Bursitis Mother   . Cancer Mother        leukemia  . Hypertension Father   . Diabetes Maternal Grandmother   . Stroke Maternal Grandmother   . Cancer Maternal Grandfather        bile duct  . Heart disease Paternal Grandmother        MI  . Cancer Paternal Grandfather        lung    Social History Social History   Tobacco Use  . Smoking status: Never Smoker  . Smokeless tobacco: Never Used  Vaping Use  . Vaping Use: Never used  Substance Use Topics  . Alcohol use: Yes    Alcohol/week: 3.0 - 4.0 standard drinks    Types: 3 - 4 Cans of beer per week  . Drug use: No     Allergies  Penicillin g benzathine   Review of Systems Review of Systems  Constitutional: Negative for chills and fever.  HENT: Positive for congestion.   Respiratory: Negative for cough, chest tightness and shortness of breath.   Gastrointestinal: Negative for abdominal pain.  Genitourinary: Negative for difficulty urinating and hematuria.  Skin: Negative for rash and wound.     Physical Exam Triage Vital Signs ED Triage Vitals  Enc Vitals Group     BP 07/14/20 1205 (!) 151/94     Pulse Rate 07/14/20 1205 80     Resp 07/14/20 1205 20     Temp 07/14/20 1205 98.4 F (36.9 C)     Temp Source 07/14/20 1205 Oral     SpO2 07/14/20 1205 96 %     Weight --      Height --      Head Circumference --      Peak Flow --      Pain Score 07/14/20 1204 5     Pain Loc --      Pain Edu? --      Excl. in [place]? --    No data found.  Updated Vital Signs BP (!)  151/94 (BP Location: Right Arm)   Pulse 80   Temp 98.4 F (36.9 C) (Oral)   Resp 20   SpO2 96%   Visual Acuity Right Eye Distance:   Left Eye Distance:   Bilateral Distance:    Right Eye Near:   Left Eye Near:    Bilateral Near:     Physical Exam Vitals and nursing note reviewed.  Constitutional:      Appearance: He is obese. He is not ill-appearing or diaphoretic.     Comments: Moves slow since sudle movements provokes his pain  HENT:     Head: Normocephalic.     Right Ear: External ear normal.     Left Ear: External ear normal.  Eyes:     General: No scleral icterus.    Conjunctiva/sclera: Conjunctivae normal.  Pulmonary:     Effort: Pulmonary effort is normal.     Comments: Mild L lower rib border tenderness on the 12th rib, but with coughing this is where he hurts worse.  Chest:     Chest wall: Tenderness present.  Abdominal:     General: Bowel sounds are normal.     Palpations: Abdomen is soft. There is no mass.     Tenderness: There is abdominal tenderness. There is no right CVA tenderness, left CVA tenderness, guarding or rebound.     Comments: Has moderate tenderness on L mid lateral abdomen.   Musculoskeletal:        General: Normal range of motion.     Cervical back: Neck supple.  Skin:    General: Skin is warm and dry.  Neurological:     Mental Status: He is alert and oriented to person, place, and time.     Gait: Gait normal.  Psychiatric:        Mood and Affect: Mood normal.        Behavior: Behavior normal.        Thought Content: Thought content normal.        Judgment: Judgment normal.      UC Treatments / Results  Labs (all labs ordered are listed, but only abnormal results are displayed) Labs Reviewed  URINE CULTURE  URINALYSIS, COMPLETE (UACMP) WITH MICROSCOPIC    EKG   Radiology DG Ribs Unilateral W/Chest Left  Result Date: 07/14/2020  CLINICAL DATA:  Pain EXAM: LEFT RIBS AND CHEST - 3+ VIEW COMPARISON:  None. FINDINGS: Frontal  chest as well as oblique and cone-down rib images were obtained. The lungs are clear. The heart size and pulmonary vascularity are normal. No adenopathy. No evident pneumothorax or pleural effusion. No evident rib fracture. IMPRESSION: No evident rib fracture.  Lungs clear. Electronically Signed   By: Lowella Grip III M.D.   On: 07/14/2020 13:14    Procedures Procedures (including critical care time)  Medications Ordered in UC Medications - No data to display  Initial Impression / Assessment and Plan / UC Course  I have reviewed the triage vital signs and the nursing notes. Has L ribcage strain. I placed him on Norco and Robaxen as noted. See instructions.  Pertinent  imaging results that were available during my care of the patient were reviewed by me and considered in my medical decision making (see chart for details).   Final Clinical Impressions(s) / UC Diagnoses   Final diagnoses:  Sprain of costal cartilage, initial encounter     Discharge Instructions     Your rib xray and lung xray are normal. Your urine test is normal.  You have strained your rib and muscle area. I will have you use ice for 20 minutes 3-5 times a day for 48 hours, then alternate with heat after that.  I will have you try some muscle relaxer's. You cant take any anti-inflammatory medications since it interacts with your phenobarbital. So try some Norco for pain as needed.     ED Prescriptions    Medication Sig Dispense Auth. Provider   methocarbamol (ROBAXIN) 500 MG tablet Take 1 tablet (500 mg total) by mouth 2 (two) times daily. 20 tablet Rodriguez-Southworth, Sunday Spillers, PA-C   HYDROcodone-acetaminophen (NORCO/VICODIN) 5-325 MG tablet Take 2 tablets by mouth every 4 (four) hours as needed. 10 tablet Rodriguez-Southworth, Sunday Spillers, PA-C     I have reviewed the PDMP during this encounter.   Shelby Mattocks, PA-C 07/14/20 1428

## 2020-07-15 ENCOUNTER — Ambulatory Visit: Payer: BC Managed Care – PPO | Admitting: Nurse Practitioner

## 2020-07-16 LAB — URINE CULTURE
Culture: NO GROWTH
Special Requests: NORMAL

## 2020-08-04 ENCOUNTER — Other Ambulatory Visit: Payer: Self-pay | Admitting: Nurse Practitioner

## 2020-08-09 ENCOUNTER — Other Ambulatory Visit: Payer: Self-pay

## 2020-08-09 ENCOUNTER — Encounter: Payer: Self-pay | Admitting: Nurse Practitioner

## 2020-08-09 ENCOUNTER — Ambulatory Visit: Payer: BC Managed Care – PPO | Admitting: Nurse Practitioner

## 2020-08-09 VITALS — BP 132/86 | HR 77 | Temp 98.8°F | Wt 202.0 lb

## 2020-08-09 DIAGNOSIS — I10 Essential (primary) hypertension: Secondary | ICD-10-CM

## 2020-08-09 DIAGNOSIS — M542 Cervicalgia: Secondary | ICD-10-CM | POA: Insufficient documentation

## 2020-08-09 DIAGNOSIS — G40909 Epilepsy, unspecified, not intractable, without status epilepticus: Secondary | ICD-10-CM | POA: Diagnosis not present

## 2020-08-09 DIAGNOSIS — E7800 Pure hypercholesterolemia, unspecified: Secondary | ICD-10-CM | POA: Diagnosis not present

## 2020-08-09 DIAGNOSIS — R0781 Pleurodynia: Secondary | ICD-10-CM

## 2020-08-09 NOTE — Assessment & Plan Note (Signed)
Chronic issue, will obtain imaging for baseline -- he is concerned for degenerative changes.  Recommend gentle stretching at home and modest weight loss + regular activity.  May alternate ice and heat for pain and take Tylenol as needed.

## 2020-08-09 NOTE — Assessment & Plan Note (Signed)
Continue diet focus at this time, ASCVD 7.5%, recheck lipid panel today.

## 2020-08-09 NOTE — Progress Notes (Signed)
BP 132/86   Pulse 77   Temp 98.8 F (37.1 C) (Oral)   Wt 202 lb (91.6 kg)   SpO2 92%   BMI 30.18 kg/m    Subjective:    Patient ID: Dillon Blake, male    DOB: December 17, 1965, 55 y.o.   MRN: [id number]  HPI: Dillon Blake is a 55 y.o. male  Chief Complaint  Patient presents with  . Follow-up    Pt states he has episodes of where his neck hurts to turn and when he pops it, pt states he feels a little better.   HYPERTENSION Continues on Lisinopril 5 MG daily. LDL was elevated on recent labs, 124.  Had Covid in September 2021 -- improved in October 2021. Hypertension status:controlled Satisfied with current treatment?yes Duration of hypertension:chronic BP monitoring frequency:occasionally BP range:varies BP medication side effects:no Medication compliance:good compliance Aspirin:no Recurrent headaches:no Visual changes:no Palpitations:no Dyspnea:no Chest pain:no Lower extremity edema:no Dizzy/lightheaded:no The 10-year ASCVD risk score Mikey Bussing DC Jr., et al., 2013) is: 7.5%   Values used to calculate the score:     Age: 53 years     Sex: Male     Is Non-Hispanic African American: No     Diabetic: No     Tobacco smoker: No     Systolic Blood Pressure: 937 mmHg     Is BP treated: Yes     HDL Cholesterol: 46 mg/dL     Total Cholesterol: 250 mg/dL   SEIZURE DISORDER: Last phenobarbital level was6/11/21 = 14. Continues on 97.2 MG at bedtime, which is dose he has been on for many years since diagnosis. Last seizure was in April 1991, first one was June of 1987. Has only had two seizures in his life. Did see neurology years ago and they did sleep testing, he reports they found that when he had lack of sleep this led to seizures. So he stays on adequate sleep regimen at home.PDMP review last fill 08/04/20, no other controlled substances noted.  RIB PAIN FOLLOW UP Was seen in UC recently on 07/14/20 -- had spit up a lot phlegm and had pop  sensation.  Was diagnosed with left rib cage strain -- was given Norco and muscle relaxer.  Had no fractures.  He denies any rib pain today, but does endorse chronic neck pain -- every 3-4 months and then it improves on own.   Status: stable Treatments attempted: muscle relaxer  Location:midline Duration:chronic Severity: mild Aggravating factors: nothing Alleviating factors: narcotics and muscle relaxer Weakness:  no Paresthesias / decreased sensation:  no  Fevers:  no  Relevant past medical, surgical, family and social history reviewed and updated as indicated. Interim medical history since our last visit reviewed. Allergies and medications reviewed and updated.  Review of Systems  Constitutional: Negative for activity change, diaphoresis, fatigue and fever.  Respiratory: Negative for cough, chest tightness, shortness of breath and wheezing.   Cardiovascular: Negative for chest pain, palpitations and leg swelling.  Gastrointestinal: Negative.  Negative for nausea.  Musculoskeletal: Positive for arthralgias.  Psychiatric/Behavioral: Negative.     Per HPI unless specifically indicated above     Objective:    BP 132/86   Pulse 77   Temp 98.8 F (37.1 C) (Oral)   Wt 202 lb (91.6 kg)   SpO2 92%   BMI 30.18 kg/m   Wt Readings from Last 3 Encounters:  08/09/20 202 lb (91.6 kg)  12/04/19 198 lb (89.8 kg)  11/28/18 186 lb 6.4 oz (84.6 kg)  Physical Exam Vitals and nursing note reviewed.  Constitutional:      General: He is awake. He is not in acute distress.    Appearance: He is well-developed and well-groomed. He is not ill-appearing.  HENT:     Head: Normocephalic and atraumatic.     Right Ear: Hearing normal. No drainage.     Left Ear: Hearing normal. No drainage.  Eyes:     General: Lids are normal.        Right eye: No discharge.        Left eye: No discharge.     Conjunctiva/sclera: Conjunctivae normal.     Pupils: Pupils are equal, round, and reactive to  light.  Neck:     Thyroid: No thyromegaly.     Vascular: No carotid bruit.     Trachea: Trachea normal.  Cardiovascular:     Rate and Rhythm: Normal rate and regular rhythm.     Heart sounds: Normal heart sounds, S1 normal and S2 normal. No murmur heard. No gallop.   Pulmonary:     Effort: Pulmonary effort is normal. No accessory muscle usage or respiratory distress.     Breath sounds: Normal breath sounds.  Abdominal:     General: Bowel sounds are normal.     Palpations: Abdomen is soft.  Musculoskeletal:        General: Normal range of motion.     Cervical back: Normal range of motion and neck supple. No erythema. No pain with movement, spinous process tenderness or muscular tenderness. Normal range of motion.     Right lower leg: No edema.     Left lower leg: No edema.  Skin:    General: Skin is warm and dry.     Capillary Refill: Capillary refill takes less than 2 seconds.     Findings: No rash.  Neurological:     Mental Status: He is alert and oriented to person, place, and time.     Deep Tendon Reflexes: Reflexes are normal and symmetric.     Reflex Scores:      Brachioradialis reflexes are 2+ on the right side and 2+ on the left side.      Patellar reflexes are 2+ on the right side and 2+ on the left side. Psychiatric:        Attention and Perception: Attention normal.        Mood and Affect: Mood normal.        Speech: Speech normal.        Behavior: Behavior normal. Behavior is cooperative.        Thought Content: Thought content normal.    Results for orders placed or performed during the hospital encounter of 07/14/20  Urine culture   Specimen: Urine, Clean Catch  Result Value Ref Range   Specimen Description      URINE, CLEAN CATCH Performed at [facility], [address] [place], [place] [address]    Special Requests      Normal Performed at [facility], [address] [place], [place] [address]    Culture      NO  GROWTH Performed at [facility], [place] [address] [place], [place] [address]    Report Status 07/16/2020 FINAL   Urinalysis, Complete w Microscopic Urine, Clean Catch  Result Value Ref Range   Color, Urine YELLOW YELLOW   APPearance CLEAR CLEAR   Specific Gravity, Urine 1.015 1.005 - 1.030   pH 6.5 5.0 -  8.0   Glucose, UA NEGATIVE NEGATIVE mg/dL   Hgb urine dipstick NEGATIVE NEGATIVE   Bilirubin Urine NEGATIVE NEGATIVE   Ketones, ur NEGATIVE NEGATIVE mg/dL   Protein, ur NEGATIVE NEGATIVE mg/dL   Nitrite NEGATIVE NEGATIVE   Leukocytes,Ua NEGATIVE NEGATIVE   Squamous Epithelial / LPF NONE SEEN 0 - 5   WBC, UA 0-5 0 - 5 WBC/hpf   RBC / HPF 0-5 0 - 5 RBC/hpf   Bacteria, UA NONE SEEN NONE SEEN      Assessment & Plan:   Problem List Items Addressed This Visit      Cardiovascular and Mediastinum   Hypertension    Chronic, ongoing with initial BP elevated, but repeat improved.  Continue current medication regimen and adjust as needed.  Recommend he check BP at home at least three mornings a week.  BMP today.  Focus on DASH diet at home.  Return in 6 months.      Relevant Orders   Basic metabolic panel     Nervous and Auditory   Seizure disorder ([place]) - Primary    Chronic, stable with no seizure since 1991.  Continue current medication regimen and check phenobarbital level next visit.  Referral to neuro if any worsening seizures present.  Return in 6 months.        Other   Elevated LDL cholesterol level    Continue diet focus at this time, ASCVD 7.5%, recheck lipid panel today.      Relevant Orders   Lipid Panel w/o Chol/HDL Ratio   Neck pain    Chronic issue, will obtain imaging for baseline -- he is concerned for degenerative changes.  Recommend gentle stretching at home and modest weight loss + regular activity.  May alternate ice and heat for pain and take Tylenol as needed.      Relevant Orders   DG Cervical Spine Complete    Other Visit Diagnoses    Rib pain  on left side       Acute and improved at this time.  Continue to monitor for return of symptoms.       Follow up plan: Return in about 6 months (around 02/06/2021) for ANNUAL PHYSICAL.

## 2020-08-09 NOTE — Assessment & Plan Note (Signed)
Chronic, stable with no seizure since 1991.  Continue current medication regimen and check phenobarbital level next visit.  Referral to neuro if any worsening seizures present.  Return in 6 months.

## 2020-08-09 NOTE — Patient Instructions (Signed)

## 2020-08-09 NOTE — Assessment & Plan Note (Signed)
Chronic, ongoing with initial BP elevated, but repeat improved.  Continue current medication regimen and adjust as needed.  Recommend he check BP at home at least three mornings a week.  BMP today.  Focus on DASH diet at home.  Return in 6 months.

## 2020-08-10 LAB — BASIC METABOLIC PANEL
BUN/Creatinine Ratio: 13 (ref 9–20)
BUN: 14 mg/dL (ref 6–24)
CO2: 23 mmol/L (ref 20–29)
Calcium: 9.4 mg/dL (ref 8.7–10.2)
Chloride: 100 mmol/L (ref 96–106)
Creatinine, Ser: 1.12 mg/dL (ref 0.76–1.27)
GFR calc Af Amer: 87 mL/min/{1.73_m2} (ref >59)
GFR calc non Af Amer: 75 mL/min/{1.73_m2} (ref >59)
Glucose: 99 mg/dL (ref 65–99)
Potassium: 4.6 mmol/L (ref 3.5–5.2)
Sodium: 139 mmol/L (ref 134–144)

## 2020-08-10 LAB — LIPID PANEL W/O CHOL/HDL RATIO
Cholesterol, Total: 257 mg/dL — ABNORMAL HIGH (ref 100–199)
HDL: 51 mg/dL (ref >39)
LDL Chol Calc (NIH): 161 mg/dL — ABNORMAL HIGH (ref 0–99)
Triglycerides: 244 mg/dL — ABNORMAL HIGH (ref 0–149)
VLDL Cholesterol Cal: 45 mg/dL — ABNORMAL HIGH (ref 5–40)

## 2020-08-10 NOTE — Progress Notes (Signed)
Contacted via MyChart The 10-year ASCVD risk score Mikey Bussing DC Jr., et al., 2013) is: 7.1%   Values used to calculate the score:     Age: 55 years     Sex: Male     Is Non-Hispanic African American: No     Diabetic: No     Tobacco smoker: No     Systolic Blood Pressure: 570 mmHg     Is BP treated: Yes     HDL Cholesterol: 51 mg/dL     Total Cholesterol: 257 mg/dL  Good afternoon Iona Beard, your labs have returned and kidney function remains stable.  Your cholesterol is still high, but continued recommendations to make lifestyle changes. Your LDL is above normal. The LDL is the bad cholesterol. Over time and in combination with inflammation and other factors, this contributes to plaque which in turn may lead to stroke and/or heart attack down the road. Sometimes high LDL is primarily genetic, and people might be eating all the right foods but still have high numbers. Other times, there is room for improvement in one's diet and eating healthier can bring this number down and potentially reduce one's risk of heart attack and/or stroke.   To reduce your LDL, Remember - more fruits and vegetables, more fish, and limit red meat and dairy products. More soy, nuts, beans, barley, lentils, oats and plant sterol ester enriched margarine instead of butter. I also encourage eliminating sugar and processed food. Remember, shop on the outside of the grocery store and visit your [employer]. If you would like to talk with me about dietary changes plus or minus medications for your cholesterol, please let me know. We should recheck your cholesterol in 6 months.  Please ensure to schedule physical for 6 months.  Any questions? Keep being awesome!!  Thank you for allowing me to participate in your care. Kindest regards, Jakyren Fluegge

## 2020-08-24 ENCOUNTER — Other Ambulatory Visit: Payer: Self-pay | Admitting: Nurse Practitioner

## 2020-08-31 ENCOUNTER — Other Ambulatory Visit: Payer: Self-pay | Admitting: Nurse Practitioner

## 2020-10-02 ENCOUNTER — Other Ambulatory Visit: Payer: Self-pay | Admitting: Nurse Practitioner

## 2020-10-02 NOTE — Telephone Encounter (Signed)
Requested Prescriptions  Pending Prescriptions Disp Refills  . lisinopril (ZESTRIL) 5 MG tablet [Pharmacy Med Name: LISINOPRIL 5 MG TABLET] 90 tablet 0    Sig: TAKE 1 TABLET BY MOUTH EVERY DAY NEEDS FOLLOW UP APPOINTMENT     Cardiovascular:  ACE Inhibitors Passed - 10/02/2020  1:13 AM      Passed - Cr in normal range and within 180 days    Creatinine, Ser  Date Value Ref Range Status  08/09/2020 1.12 0.76 - 1.27 mg/dL Final         Passed - K in normal range and within 180 days    Potassium  Date Value Ref Range Status  08/09/2020 4.6 3.5 - 5.2 mmol/L Final         Passed - Patient is not pregnant      Passed - Last BP in normal range    BP Readings from Last 1 Encounters:  08/09/20 132/86         Passed - Valid encounter within last 6 months    Recent Outpatient Visits          1 month ago Seizure disorder ([place])   [place] Cannady, Barbaraann Faster, NP   10 months ago Encounter for annual physical exam   [place], Henrine Screws T, NP   1 year ago Seizure disorder ([place])   [place], Barbaraann Faster, NP   1 year ago Seizure disorder ([place])   [place], Henrine Screws T, NP   2 years ago Routine general medical examination at a health care facility   [place], NP

## 2020-10-28 ENCOUNTER — Other Ambulatory Visit: Payer: Self-pay | Admitting: Nurse Practitioner

## 2020-10-28 NOTE — Telephone Encounter (Signed)
Requested medication (s) are due for refill today: yes  Requested medication (s) are on the active medication list:  yes  Last refill:  08/04/2020  Future visit scheduled: no  Notes to clinic:  this refill cannot be delegated   Requested Prescriptions  Pending Prescriptions Disp Refills   PHENobarbital (LUMINAL) 97.2 MG tablet [Pharmacy Med Name: PHENOBARBITAL 97.2 MG TABLET] 90 tablet     Sig: TAKE 1 TABLET BY MOUTH EVERYDAY AT BEDTIME      Not Delegated - Neurology: Anticonvulsants - Controlled - phenobarbital Failed - 10/28/2020  1:00 PM      Failed - This refill cannot be delegated      Failed - Phenobarbital in normal range and within 360 days    No results found for: LABPHEN, [place] encounter within last 12 months    Recent Outpatient Visits           2 months ago Seizure disorder ([place])   [place] Cannady, Barbaraann Faster, NP   10 months ago Encounter for annual physical exam   [place], Henrine Screws T, NP   1 year ago Seizure disorder ([place])   [place], Barbaraann Faster, NP   1 year ago Seizure disorder ([place])   [place], Henrine Screws T, NP   2 years ago Routine general medical examination at a health care facility   [place], NP

## 2020-10-28 NOTE — Telephone Encounter (Signed)
Pt last apt on 08/09/2020 per note Return in about 6 months (around 02/06/2021) for ANNUAL PHYSICAL. Would he need sooner apt?

## 2020-12-31 ENCOUNTER — Other Ambulatory Visit: Payer: Self-pay | Admitting: Nurse Practitioner

## 2021-01-11 ENCOUNTER — Other Ambulatory Visit: Payer: Self-pay | Admitting: Nurse Practitioner

## 2021-01-11 NOTE — Telephone Encounter (Signed)
Requested Prescriptions  Pending Prescriptions Disp Refills  . tadalafil (CIALIS) 20 MG tablet [Pharmacy Med Name: TADALAFIL 20 MG TABLET] 4 tablet 12    Sig: TAKE 1 TABLET BY MOUTH EVERY DAY AS NEEDED     Urology: Erectile Dysfunction Agents Passed - 01/11/2021  4:49 PM      Passed - Last BP in normal range    BP Readings from Last 1 Encounters:  08/09/20 132/86         Passed - Valid encounter within last 12 months    Recent Outpatient Visits          5 months ago Seizure disorder ([place])   [place], Henrine Screws T, NP   1 year ago Encounter for annual physical exam   [place], Henrine Screws T, NP   1 year ago Seizure disorder ([place])   [place], Henrine Screws T, NP   2 years ago Seizure disorder ([place])   [place], Henrine Screws T, NP   3 years ago Routine general medical examination at a health care facility   [place], NP      Future Appointments            In 3 weeks Cannady, Barbaraann Faster, NP [facility]

## 2021-01-27 ENCOUNTER — Other Ambulatory Visit: Payer: Self-pay | Admitting: Nurse Practitioner

## 2021-01-27 NOTE — Telephone Encounter (Signed)
Requested medication (s) are due for refill today: yes  Requested medication (s) are on the active medication list: yes  Last refill:  10/28/20 #90  Future visit scheduled: yes  Notes to clinic:  Please review for refill. Refill not delegated per protocol    Requested Prescriptions  Pending Prescriptions Disp Refills   PHENobarbital (LUMINAL) 97.2 MG tablet [Pharmacy Med Name: PHENOBARBITAL 97.2 MG TABLET] 90 tablet 0    Sig: TAKE 1 TABLET BY MOUTH EVERYDAY AT BEDTIME      Not Delegated - Neurology: Anticonvulsants - Controlled - phenobarbital Failed - 01/27/2021  1:26 PM      Failed - This refill cannot be delegated      Failed - Phenobarbital in normal range and within 360 days    No results found for: LABPHEN, [place] encounter within last 12 months    Recent Outpatient Visits           5 months ago Seizure disorder ([place])   [place], Barbaraann Faster, NP   1 year ago Encounter for annual physical exam   [place], Henrine Screws T, NP   1 year ago Seizure disorder ([place])   [place], Henrine Screws T, NP   2 years ago Seizure disorder ([place])   [place], Henrine Screws T, NP   3 years ago Routine general medical examination at a health care facility   [place], NP       Future Appointments             In 1 week Cannady, Barbaraann Faster, NP [facility]

## 2021-02-01 ENCOUNTER — Other Ambulatory Visit: Payer: Self-pay | Admitting: Nurse Practitioner

## 2021-02-01 NOTE — Telephone Encounter (Signed)
Requested medications are due for refill today.  yes  Requested medications are on the active medications list.  yes  Last refill. 12/31/2020  Future visit scheduled.   yes  Notes to clinic.  Patient has been given 2 courtesy refills. Pt has appointment in 2 days.

## 2021-02-03 ENCOUNTER — Encounter: Payer: Self-pay | Admitting: Nurse Practitioner

## 2021-02-03 ENCOUNTER — Ambulatory Visit (INDEPENDENT_AMBULATORY_CARE_PROVIDER_SITE_OTHER): Payer: BC Managed Care – PPO | Admitting: Nurse Practitioner

## 2021-02-03 ENCOUNTER — Ambulatory Visit: Payer: BC Managed Care – PPO | Admitting: Nurse Practitioner

## 2021-02-03 ENCOUNTER — Other Ambulatory Visit: Payer: Self-pay

## 2021-02-03 VITALS — BP 132/80 | HR 73 | Temp 98.9°F | Ht 69.0 in | Wt 207.8 lb

## 2021-02-03 DIAGNOSIS — Z1211 Encounter for screening for malignant neoplasm of colon: Secondary | ICD-10-CM

## 2021-02-03 DIAGNOSIS — Z23 Encounter for immunization: Secondary | ICD-10-CM | POA: Diagnosis not present

## 2021-02-03 DIAGNOSIS — Z833 Family history of diabetes mellitus: Secondary | ICD-10-CM | POA: Diagnosis not present

## 2021-02-03 DIAGNOSIS — I10 Essential (primary) hypertension: Secondary | ICD-10-CM | POA: Diagnosis not present

## 2021-02-03 DIAGNOSIS — M654 Radial styloid tenosynovitis [de Quervain]: Secondary | ICD-10-CM | POA: Diagnosis not present

## 2021-02-03 DIAGNOSIS — Z0000 Encounter for general adult medical examination without abnormal findings: Secondary | ICD-10-CM

## 2021-02-03 DIAGNOSIS — G40909 Epilepsy, unspecified, not intractable, without status epilepticus: Secondary | ICD-10-CM | POA: Diagnosis not present

## 2021-02-03 DIAGNOSIS — L918 Other hypertrophic disorders of the skin: Secondary | ICD-10-CM

## 2021-02-03 DIAGNOSIS — E7800 Pure hypercholesterolemia, unspecified: Secondary | ICD-10-CM

## 2021-02-03 DIAGNOSIS — Z5181 Encounter for therapeutic drug level monitoring: Secondary | ICD-10-CM

## 2021-02-03 DIAGNOSIS — N400 Enlarged prostate without lower urinary tract symptoms: Secondary | ICD-10-CM | POA: Diagnosis not present

## 2021-02-03 MED ORDER — SHINGRIX 50 MCG/0.5ML IM SUSR: 0.5000 mL | Freq: Once | INTRAMUSCULAR | 0 refills | Status: AC

## 2021-02-03 MED ORDER — LISINOPRIL 5 MG PO TABS: ORAL_TABLET | ORAL | 4 refills | Status: DC

## 2021-02-03 MED ORDER — PHENOBARBITAL 97.2 MG PO TABS: ORAL_TABLET | ORAL | 4 refills | Status: DC

## 2021-02-03 NOTE — Progress Notes (Signed)
BP 132/80 (BP Location: Left Arm)   Pulse 73   Temp 98.9 F (37.2 C) (Oral)   Ht 5' 9"  (1.753 m)   Wt 207 lb 12.8 oz (94.3 kg)   SpO2 95%   BMI 30.69 kg/m    Subjective:    Patient ID: Dillon Blake, male    DOB: 1966/03/13, 55 y.o.   MRN: [id number]  HPI: Dillon Blake is a 55 y.o. male presenting on 02/03/2021 for comprehensive medical examination. Current medical complaints include:none  He currently lives with: girlfriend Interim Problems from his last visit: no   Mother was diagnosed with diabetes months back and would like sugar checked.  Has skin tags he would like provider to look at today.  Would like referral to ortho to talk about surgery to his left hand, which he has discussed with them in past.  HYPERTENSION Continues on Lisinopril 5 MG daily.  LDL was elevated on recent labs, 161. Hypertension status: controlled  Satisfied with current treatment? yes Duration of hypertension: chronic BP monitoring frequency:  not checking BP range:  BP medication side effects:  no Medication compliance: good compliance Aspirin: no Recurrent headaches: no Visual changes: no Palpitations: no Dyspnea: no Chest pain: no Lower extremity edema: no Dizzy/lightheaded: no  The 10-year ASCVD risk score Mikey Bussing DC Jr., et al., 2013) is: 7.7%   Values used to calculate the score:     Age: 49 years     Sex: Male     Is Non-Hispanic African American: No     Diabetic: No     Tobacco smoker: No     Systolic Blood Pressure: 466 mmHg     Is BP treated: Yes     HDL Cholesterol: 51 mg/dL     Total Cholesterol: 257 mg/dL   SEIZURE DISORDER: Last phenobarbital level was 14.  Continues on 97.2 MG at bedtime, whch is dose he has been on for many years since diagnosis.  Last seizure was in April 1991, first one was June of 1987.  Has only had two seizures in his life.  Did see neurology years ago and they did sleep testing, he reports they found that when he had lack of sleep this  led to seizures.  So he stays on adequate sleep regimen at home.  PMP review last fill 01/31/21, no other controlled substances noted.  COUGH Has had ongoing irritation and cough for years, worse in morning and after eating.  His father has history of needing "esophagus stretched".  Does not feel like food gets stuck or lodged.    Has history of costal cartilage sprain in January 2022.   Duration: months Circumstances of initial development of cough: unknown Cough severity: moderate Cough description: non-productive and dry Aggravating factors:  worse in the AM Alleviating factors: nothing Status:  stable Treatments attempted: none Wheezing: no Shortness of breath: no Chest pain: no Chest tightness:no Nasal congestion: no Runny nose:  typically first thing in the morning Postnasal drip: yes Frequent throat clearing or swallowing: yes Hemoptysis: no Fevers: no Night sweats: no Weight loss: no Heartburn: no Recent foreign travel: no Tuberculosis contacts: no    Functional Status Survey: Is the patient deaf or have difficulty hearing?: No Does the patient have difficulty seeing, even when wearing glasses/contacts?: No Does the patient have difficulty concentrating, remembering, or making decisions?: No Does the patient have difficulty walking or climbing stairs?: No Does the patient have difficulty dressing or bathing?: No Does the patient have  difficulty doing errands alone such as visiting a doctor's office or shopping?: No  FALL RISK: Fall Risk  02/03/2021 08/06/2019 11/19/2017 05/22/2017  Falls in the past year? 0 0 No No  Number falls in past yr: 0 0 - -  Injury with Fall? 0 0 - -  Risk for fall due to : No Fall Risks - - -  Follow up Falls evaluation completed Falls evaluation completed - -    Depression Screen Depression screen [facility] 2/9 02/03/2021 12/04/2019 08/06/2019 11/19/2017 05/22/2017  Decreased Interest 0 0 0 0 0  Down, Depressed, Hopeless 0 0 0 0 0  PHQ - 2 Score  0 0 0 0 0  Altered sleeping - 1 - - -  Tired, decreased energy - 0 - - -  Change in appetite - 1 - - -  Feeling bad or failure about yourself  - 0 - - -  Trouble concentrating - 0 - - -  Moving slowly or fidgety/restless - 0 - - -  Suicidal thoughts - 0 - - -  PHQ-9 Score - 2 - - -  Difficult doing work/chores - Not difficult at all - - -    Advanced Directives <no information>  Past Medical History:  Past Medical History:  Diagnosis Date   Allergy    ED (erectile dysfunction)    Impotence, organic    Rib injury    Seizure disorder ([place])     Surgical History:  Past Surgical History:  Procedure Laterality Date   TONSILLECTOMY  1972    Medications:  Current Outpatient Medications on File Prior to Visit  Medication Sig   COVID-19 Specimen Collection KIT TEST AS DIRECTED TODAY   tadalafil (CIALIS) 20 MG tablet TAKE 1 TABLET BY MOUTH EVERY DAY AS NEEDED   No current facility-administered medications on file prior to visit.    Allergies:  Allergies  Allergen Reactions   Keflex [Cephalexin]    Penicillin G Benzathine     Social History:  Social History   Socioeconomic History   Marital status: Unknown    Spouse name: Not on file   Number of children: Not on file   Years of education: Not on file   Highest education level: Not on file  Occupational History   Not on file  Tobacco Use   Smoking status: Never   Smokeless tobacco: Never  Vaping Use   Vaping Use: Never used  Substance and Sexual Activity   Alcohol use: Yes    Alcohol/week: 3.0 - 4.0 standard drinks    Types: 3 - 4 Cans of beer per week   Drug use: No   Sexual activity: Yes  Other Topics Concern   Not on file  Social History Narrative   Not on file   Social Determinants of Health   Financial Resource Strain: Low Risk    Difficulty of Paying Living Expenses: Not hard at all  Food Insecurity: No Food Insecurity   Worried About Charity fundraiser in the Last Year: Never true   [place] in the Last Year: Never true  Transportation Needs: No Transportation Needs   Lack of Transportation (Medical): No   Lack of Transportation (Non-Medical): No  Physical Activity: Insufficiently Active   Days of Exercise per Week: 4 days   Minutes of Exercise per Session: 30 min  Stress: No Stress Concern Present   Feeling of Stress : Only a little  Social Connections: Moderately Integrated   Frequency of  Communication with Friends and Family: Three times a week   Frequency of Social Gatherings with Friends and Family: Three times a week   Attends Religious Services: 1 to 4 times per year   Active Member of Clubs or Organizations: No   Attends Music therapist: Never   Marital Status: Living with partner  Intimate Partner Violence: Not At Risk   Fear of Current or Ex-Partner: No   Emotionally Abused: No   Physically Abused: No   Sexually Abused: No   Social History   Tobacco Use  Smoking Status Never  Smokeless Tobacco Never   Social History   Substance and Sexual Activity  Alcohol Use Yes   Alcohol/week: 3.0 - 4.0 standard drinks   Types: 3 - 4 Cans of beer per week    Family History:  Family History  Problem Relation Age of Onset   Arthritis Mother    Lupus Mother    Seizures Mother    Allergies Mother    Bursitis Mother    Cancer Mother        leukemia   Diabetes Mother    Fibromyalgia Mother    Hypertension Father    Diabetes Brother    Diabetes Maternal Grandmother    Stroke Maternal Grandmother    Cancer Maternal Grandfather        bile duct   Heart disease Paternal Grandmother        MI   Cancer Paternal Grandfather        lung    Past medical history, surgical history, medications, allergies, family history and social history reviewed with patient today and changes made to appropriate areas of the chart.   Review of Systems - negative All other ROS negative except what is listed above and in the HPI.      Objective:    BP  132/80 (BP Location: Left Arm)   Pulse 73   Temp 98.9 F (37.2 C) (Oral)   Ht 5' 9"  (1.753 m)   Wt 207 lb 12.8 oz (94.3 kg)   SpO2 95%   BMI 30.69 kg/m   Wt Readings from Last 3 Encounters:  02/03/21 207 lb 12.8 oz (94.3 kg)  08/09/20 202 lb (91.6 kg)  12/04/19 198 lb (89.8 kg)    Physical Exam Vitals and nursing note reviewed.  Constitutional:      General: He is awake. He is not in acute distress.    Appearance: He is well-developed and well-groomed. He is not ill-appearing.  HENT:     Head: Normocephalic and atraumatic.     Right Ear: Hearing, tympanic membrane, ear canal and external ear normal. No drainage.     Left Ear: Hearing, tympanic membrane, ear canal and external ear normal. No drainage.     Nose: Nose normal.     Mouth/Throat:     Pharynx: Uvula midline.  Eyes:     General: Lids are normal.        Right eye: No discharge.        Left eye: No discharge.     Extraocular Movements: Extraocular movements intact.     Conjunctiva/sclera: Conjunctivae normal.     Pupils: Pupils are equal, round, and reactive to light.     Visual Fields: Right eye visual fields normal and left eye visual fields normal.  Neck:     Thyroid: No thyromegaly.     Vascular: No carotid bruit or JVD.     Trachea: Trachea normal.  Cardiovascular:  Rate and Rhythm: Normal rate and regular rhythm.     Heart sounds: Normal heart sounds, S1 normal and S2 normal. No murmur heard.   No gallop.  Pulmonary:     Effort: Pulmonary effort is normal. No accessory muscle usage or respiratory distress.     Breath sounds: Normal breath sounds.  Abdominal:     General: Bowel sounds are normal.     Palpations: Abdomen is soft. There is no hepatomegaly or splenomegaly.     Tenderness: There is no abdominal tenderness.  Musculoskeletal:        General: Normal range of motion.     Cervical back: Normal range of motion and neck supple.     Right lower leg: No edema.     Left lower leg: No edema.   Lymphadenopathy:     Head:     Right side of head: No submental, submandibular, tonsillar, preauricular or posterior auricular adenopathy.     Left side of head: No submental, submandibular, tonsillar, preauricular or posterior auricular adenopathy.     Cervical: No cervical adenopathy.  Skin:    General: Skin is warm and dry.     Capillary Refill: Capillary refill takes less than 2 seconds.     Findings: No rash.     Comments: Multiple skin tags noted, especially under right axilla.  Scattered seborrheic keratosis.  Neurological:     Mental Status: He is alert and oriented to person, place, and time.     Cranial Nerves: Cranial nerves are intact.     Gait: Gait is intact.     Deep Tendon Reflexes: Reflexes are normal and symmetric.     Reflex Scores:      Brachioradialis reflexes are 2+ on the right side and 2+ on the left side.      Patellar reflexes are 2+ on the right side and 2+ on the left side. Psychiatric:        Attention and Perception: Attention normal.        Mood and Affect: Mood normal.        Speech: Speech normal.        Behavior: Behavior normal. Behavior is cooperative.        Thought Content: Thought content normal.        Cognition and Memory: Cognition normal.        Judgment: Judgment normal.   Results for orders placed or performed in visit on 17/51/02  Basic metabolic panel  Result Value Ref Range   Glucose 99 65 - 99 mg/dL   BUN 14 6 - 24 mg/dL   Creatinine, Ser 1.12 0.76 - 1.27 mg/dL   GFR calc non Af Amer 75 >59 mL/min/1.73   GFR calc Af Amer 87 >59 mL/min/1.73   BUN/Creatinine Ratio 13 9 - 20   Sodium 139 134 - 144 mmol/L   Potassium 4.6 3.5 - 5.2 mmol/L   Chloride 100 96 - 106 mmol/L   CO2 23 20 - 29 mmol/L   Calcium 9.4 8.7 - 10.2 mg/dL  Lipid Panel w/o Chol/HDL Ratio  Result Value Ref Range   Cholesterol, Total 257 (H) 100 - 199 mg/dL   Triglycerides 244 (H) 0 - 149 mg/dL   HDL 51 >39 mg/dL   VLDL Cholesterol Cal 45 (H) 5 - 40 mg/dL    LDL Chol Calc (NIH) 161 (H) 0 - 99 mg/dL      Assessment & Plan:   Problem List Items Addressed This Visit  Cardiovascular and Mediastinum   Hypertension    Chronic, ongoing with initial BP elevated, but repeat improved.  Continue current medication regimen and adjust as needed.  Recommend he check BP at home at least three mornings a week.  CMP, CBC, TSH today.  Focus on DASH diet at home.  Return in 6 months.      Relevant Medications   lisinopril (ZESTRIL) 5 MG tablet   Other Relevant Orders   CBC with Differential/Platelet   Comprehensive metabolic panel   TSH     Nervous and Auditory   Seizure disorder ([place]) - Primary    Chronic, stable with no seizure since 1991.  Continue current medication regimen and check phenobarbital level annually.  Referral to neuro if any worsening seizures present.  Return in 6 months.      Relevant Medications   PHENobarbital (LUMINAL) 97.2 MG tablet (Start on 03/03/2021)   Other Relevant Orders   Phenobarbital level     Musculoskeletal and Integument   Tendinitis, de Quervain's    Ongoing issue, would like referral to previous ortho to discuss surgery.  Referral placed.      Relevant Orders   Ambulatory referral to Orthopedics   Multiple acquired skin tags    Referral to dermatology placed today.      Relevant Orders   Ambulatory referral to Dermatology     Other   Medication monitoring encounter    Check Phenobarbital level      Relevant Orders   Phenobarbital level   Elevated LDL cholesterol level    Continue diet focus at this time, ASCVD 7.7%, recheck lipid panel today.      Relevant Orders   Lipid Panel w/o Chol/HDL Ratio   Family history of diabetes mellitus in mother    Check A1c today per patient request for screening.      Relevant Orders   HgB A1c   Healthcare maintenance    Health maintenance reviewed with patient: - Cologuard ordered today - Shingrix ordered to pharmacy - [id number] or 13 not applicable  until age 15 - Covid -- recommend booster - Influenza, recommend obtain once available.      Other Visit Diagnoses     Benign prostatic hyperplasia without lower urinary tract symptoms       PSA check on labs today.   Relevant Orders   PSA   Colon cancer screening       Cologuard ordered today.   Relevant Orders   Cologuard   Need for shingles vaccine       Shingrix ordered        Discussed aspirin prophylaxis for myocardial infarction prevention and decision was it was not indicated  LABORATORY TESTING:  Health maintenance labs ordered today as discussed above.   The natural history of prostate cancer and ongoing controversy regarding screening and potential treatment outcomes of prostate cancer has been discussed with the patient. The meaning of a false positive PSA and a false negative PSA has been discussed. He indicates understanding of the limitations of this screening test and wishes to proceed with screening PSA testing.   IMMUNIZATIONS:   - Tdap: Tetanus vaccination status reviewed: last tetanus booster within 10 years. - Influenza: Up to date - Pneumovax: Not applicable - Prevnar: Not applicable - Zostavax vaccine: ordered to pharmacy  SCREENING: - Colonoscopy:  Cologuard  ordered Discussed with patient purpose of the colonoscopy is to detect colon cancer at curable precancerous or early stages   - AAA Screening: Not  applicable  -Hearing Test: Not applicable  -Spirometry: Not applicable   PATIENT COUNSELING:    Sexuality: Discussed sexually transmitted diseases, partner selection, use of condoms, avoidance of unintended pregnancy  and contraceptive alternatives.   Advised to avoid cigarette smoking.  I discussed with the patient that most people either abstain from alcohol or drink within safe limits (<=14/week and <=4 drinks/occasion for males, <=7/weeks and <= 3 drinks/occasion for females) and that the risk for alcohol disorders and other health effects  rises proportionally with the number of drinks per week and how often a drinker exceeds daily limits.  Discussed cessation/primary prevention of drug use and availability of treatment for abuse.   Diet: Encouraged to adjust caloric intake to maintain  or achieve ideal body weight, to reduce intake of dietary saturated fat and total fat, to limit sodium intake by avoiding high sodium foods and not adding table salt, and to maintain adequate dietary potassium and calcium preferably from fresh fruits, vegetables, and low-fat dairy products.    Stressed the importance of regular exercise  Injury prevention: Discussed safety belts, safety helmets, smoke detector, smoking near bedding or upholstery.   Dental health: Discussed importance of regular tooth brushing, flossing, and dental visits.   Follow up plan: NEXT PREVENTATIVE PHYSICAL DUE IN 1 YEAR. Return in about 6 months (around 08/06/2021) for HTN/HLD, SEIZURES.

## 2021-02-03 NOTE — Assessment & Plan Note (Signed)
Continue diet focus at this time, ASCVD 7.7%, recheck lipid panel today.

## 2021-02-03 NOTE — Assessment & Plan Note (Signed)
Referral to dermatology placed today.

## 2021-02-03 NOTE — Patient Instructions (Signed)

## 2021-02-03 NOTE — Assessment & Plan Note (Signed)
Chronic, ongoing with initial BP elevated, but repeat improved.  Continue current medication regimen and adjust as needed.  Recommend he check BP at home at least three mornings a week.  CMP, CBC, TSH today.  Focus on DASH diet at home.  Return in 6 months.

## 2021-02-03 NOTE — Assessment & Plan Note (Signed)
Check A1c today per patient request for screening.

## 2021-02-03 NOTE — Assessment & Plan Note (Signed)
Check Phenobarbital level

## 2021-02-03 NOTE — Assessment & Plan Note (Signed)
Health maintenance reviewed with patient: - Cologuard ordered today - Shingrix ordered to pharmacy - [id number] or [id number] not applicable until age 53 - Covid -- recommend booster - Influenza, recommend obtain once available.

## 2021-02-03 NOTE — Assessment & Plan Note (Signed)
Ongoing issue, would like referral to previous ortho to discuss surgery.  Referral placed.

## 2021-02-03 NOTE — Assessment & Plan Note (Signed)
Chronic, stable with no seizure since 1991.  Continue current medication regimen and check phenobarbital level annually.  Referral to neuro if any worsening seizures present.  Return in 6 months.

## 2021-02-04 LAB — COMPREHENSIVE METABOLIC PANEL
ALT: 48 IU/L — ABNORMAL HIGH (ref 0–44)
AST: 43 IU/L — ABNORMAL HIGH (ref 0–40)
Albumin/Globulin Ratio: 1.6 (ref 1.2–2.2)
Albumin: 4.5 g/dL (ref 3.8–4.9)
Alkaline Phosphatase: 78 IU/L (ref 44–121)
BUN/Creatinine Ratio: 15 (ref 9–20)
BUN: 15 mg/dL (ref 6–24)
Bilirubin Total: 0.3 mg/dL (ref 0.0–1.2)
CO2: 22 mmol/L (ref 20–29)
Calcium: 9.4 mg/dL (ref 8.7–10.2)
Chloride: 99 mmol/L (ref 96–106)
Creatinine, Ser: 0.99 mg/dL (ref 0.76–1.27)
Globulin, Total: 2.8 g/dL (ref 1.5–4.5)
Glucose: 76 mg/dL (ref 65–99)
Potassium: 4.1 mmol/L (ref 3.5–5.2)
Sodium: 139 mmol/L (ref 134–144)
Total Protein: 7.3 g/dL (ref 6.0–8.5)
eGFR: 91 mL/min/{1.73_m2} (ref >59)

## 2021-02-04 LAB — PSA: Prostate Specific Ag, Serum: 0.9 ng/mL (ref 0.0–4.0)

## 2021-02-04 LAB — CBC WITH DIFFERENTIAL/PLATELET
Basophils Absolute: 0.1 10*3/uL (ref 0.0–0.2)
Basos: 1 %
EOS (ABSOLUTE): 0.2 10*3/uL (ref 0.0–0.4)
Eos: 3 %
Hematocrit: 46.7 % (ref 37.5–51.0)
Hemoglobin: 16.4 g/dL (ref 13.0–17.7)
Immature Grans (Abs): 0 10*3/uL (ref 0.0–0.1)
Immature Granulocytes: 0 %
Lymphocytes Absolute: 2.7 10*3/uL (ref 0.7–3.1)
Lymphs: 33 %
MCH: 31.5 pg (ref 26.6–33.0)
MCHC: 35.1 g/dL (ref 31.5–35.7)
MCV: 90 fL (ref 79–97)
Monocytes Absolute: 0.7 10*3/uL (ref 0.1–0.9)
Monocytes: 8 %
Neutrophils Absolute: 4.5 10*3/uL (ref 1.4–7.0)
Neutrophils: 55 %
Platelets: 261 10*3/uL (ref 150–450)
RBC: 5.2 x10E6/uL (ref 4.14–5.80)
RDW: 12.9 % (ref 11.6–15.4)
WBC: 8.2 10*3/uL (ref 3.4–10.8)

## 2021-02-04 LAB — HEMOGLOBIN A1C
Est. average glucose Bld gHb Est-mCnc: 108 mg/dL
Hgb A1c MFr Bld: 5.4 % (ref 4.8–5.6)

## 2021-02-04 LAB — LIPID PANEL W/O CHOL/HDL RATIO
Cholesterol, Total: 253 mg/dL — ABNORMAL HIGH (ref 100–199)
HDL: 41 mg/dL (ref >39)
LDL Chol Calc (NIH): 130 mg/dL — ABNORMAL HIGH (ref 0–99)
Triglycerides: 453 mg/dL — ABNORMAL HIGH (ref 0–149)
VLDL Cholesterol Cal: 82 mg/dL — ABNORMAL HIGH (ref 5–40)

## 2021-02-04 LAB — TSH: TSH: 1.57 u[IU]/mL (ref 0.450–4.500)

## 2021-02-04 LAB — PHENOBARBITAL LEVEL: Phenobarbital, Serum: 12 ug/mL — ABNORMAL LOW (ref 15–40)

## 2021-02-04 NOTE — Progress Notes (Signed)
Contacted via MyChart The 10-year ASCVD risk score Mikey Bussing DC Jr., et al., 2013) is: 9.2%   Values used to calculate the score:     Age: 55 years     Sex: Male     Is Non-Hispanic African American: No     Diabetic: No     Tobacco smoker: No     Systolic Blood Pressure: 584 mmHg     Is BP treated: Yes     HDL Cholesterol: 41 mg/dL     Total Cholesterol: 253 mg/dL   Good morning Dillon Blake, your labs have returned: - A1c is 5.4% meaning no prediabetes or diabetes. - CBC shows no anemia. - Kidney function, creatinine and eGFR, is normal.  Liver function, AST and ALT, is showing some mild elevation.  I would recommend lowering any Tylenol or alcohol use, if either are used.   - Thyroid and prostate labs normal. - Phenobarbital lab is on lower side, baseline for you, we look to ensure this is not too high -- continue current dosing since you remain seizure free. - Your cholesterol is still high, but continued recommendations to make lifestyle changes. Your LDL is above normal. The LDL is the bad cholesterol. Over time and in combination with inflammation and other factors, this contributes to plaque which in turn may lead to stroke and/or heart attack down the road. Sometimes high LDL is primarily genetic, and people might be eating all the right foods but still have high numbers. Other times, there is room for improvement in one's diet and eating healthier can bring this number down and potentially reduce one's risk of heart attack and/or stroke.   To reduce your LDL, Remember - more fruits and vegetables, more fish, and limit red meat and dairy products. More soy, nuts, beans, barley, lentils, oats and plant sterol ester enriched margarine instead of butter. I also encourage eliminating sugar and processed food. Remember, shop on the outside of the grocery store and visit your [employer]. If you would like to talk with me about dietary changes plus or minus medications for your  cholesterol, please let me know. We should recheck your cholesterol in 6 months.  Any questions? Keep being awesome!!  Thank you for allowing me to participate in your care.  I appreciate you. Kindest regards, Stephano Arrants

## 2021-02-06 DIAGNOSIS — M79605 Pain in left leg: Secondary | ICD-10-CM

## 2021-02-06 DIAGNOSIS — M79604 Pain in right leg: Secondary | ICD-10-CM

## 2021-02-06 NOTE — Addendum Note (Signed)
Addended by: Marnee Guarneri T on: 02/06/2021 05:12 PM   Modules accepted: Orders

## 2021-03-01 DIAGNOSIS — M79604 Pain in right leg: Secondary | ICD-10-CM | POA: Diagnosis not present

## 2021-03-01 DIAGNOSIS — M25551 Pain in right hip: Secondary | ICD-10-CM | POA: Diagnosis not present

## 2021-03-01 DIAGNOSIS — M79605 Pain in left leg: Secondary | ICD-10-CM | POA: Diagnosis not present

## 2021-03-01 DIAGNOSIS — M545 Low back pain, unspecified: Secondary | ICD-10-CM | POA: Diagnosis not present

## 2021-03-08 DIAGNOSIS — M79605 Pain in left leg: Secondary | ICD-10-CM | POA: Diagnosis not present

## 2021-03-08 DIAGNOSIS — M79604 Pain in right leg: Secondary | ICD-10-CM | POA: Diagnosis not present

## 2021-03-08 DIAGNOSIS — M25551 Pain in right hip: Secondary | ICD-10-CM | POA: Diagnosis not present

## 2021-03-08 DIAGNOSIS — M545 Low back pain, unspecified: Secondary | ICD-10-CM | POA: Diagnosis not present

## 2021-03-15 DIAGNOSIS — M79604 Pain in right leg: Secondary | ICD-10-CM | POA: Diagnosis not present

## 2021-03-15 DIAGNOSIS — M79605 Pain in left leg: Secondary | ICD-10-CM | POA: Diagnosis not present

## 2021-03-15 DIAGNOSIS — M545 Low back pain, unspecified: Secondary | ICD-10-CM | POA: Diagnosis not present

## 2021-03-15 DIAGNOSIS — M25551 Pain in right hip: Secondary | ICD-10-CM | POA: Diagnosis not present

## 2021-03-23 DIAGNOSIS — M79605 Pain in left leg: Secondary | ICD-10-CM | POA: Diagnosis not present

## 2021-03-23 DIAGNOSIS — M545 Low back pain, unspecified: Secondary | ICD-10-CM | POA: Diagnosis not present

## 2021-03-23 DIAGNOSIS — M25551 Pain in right hip: Secondary | ICD-10-CM | POA: Diagnosis not present

## 2021-03-23 DIAGNOSIS — M79604 Pain in right leg: Secondary | ICD-10-CM | POA: Diagnosis not present

## 2021-06-30 ENCOUNTER — Ambulatory Visit: Payer: BC Managed Care – PPO | Admitting: Nurse Practitioner

## 2021-06-30 ENCOUNTER — Encounter: Payer: Self-pay | Admitting: Nurse Practitioner

## 2021-06-30 ENCOUNTER — Other Ambulatory Visit: Payer: Self-pay

## 2021-06-30 VITALS — BP 141/87 | HR 84 | Temp 98.7°F | Wt 203.0 lb

## 2021-06-30 DIAGNOSIS — N529 Male erectile dysfunction, unspecified: Secondary | ICD-10-CM | POA: Diagnosis not present

## 2021-06-30 NOTE — Progress Notes (Signed)
BP (!) 141/87    Pulse 84    Temp 98.7 F (37.1 C) (Oral)    Wt 203 lb (92.1 kg)    SpO2 98%    BMI 29.98 kg/m    Subjective:    Patient ID: Dillon Blake, male    DOB: 03-May-1966, 56 y.o.   MRN: [id number]  HPI: Dillon Blake is a 56 y.o. male  Chief Complaint  Patient presents with   Erectile Dysfunction    Pt states the cialis is not working for him anymore. Wants to discuss options. States he thinks he has low testosterone.     ERECTILE DYSFUNCTION Patient states he was on Cialis but it is no longer working for him.  States he feels like his penis is thinner.  States he doesn't have an erection in the morning.  He has noticed that he has lost muscle mass and cries more when watching TV.  He doesn't have a lot of energy when he gets homes from work.  He walks 10-5998 steps per day.  He received a testosterone booster from a nutrition shop and it seems like that has helped his energy.      Relevant past medical, surgical, family and social history reviewed and updated as indicated. Interim medical history since our last visit reviewed. Allergies and medications reviewed and updated.  Review of Systems  Genitourinary:        Erectile dysfunction   Per HPI unless specifically indicated above     Objective:    BP (!) 141/87    Pulse 84    Temp 98.7 F (37.1 C) (Oral)    Wt 203 lb (92.1 kg)    SpO2 98%    BMI 29.98 kg/m   Wt Readings from Last 3 Encounters:  06/30/21 203 lb (92.1 kg)  02/03/21 207 lb 12.8 oz (94.3 kg)  08/09/20 202 lb (91.6 kg)    Physical Exam Vitals and nursing note reviewed.  Constitutional:      General: He is not in acute distress.    Appearance: Normal appearance. He is not ill-appearing, toxic-appearing or diaphoretic.  HENT:     Head: Normocephalic.     Right Ear: External ear normal.     Left Ear: External ear normal.     Nose: Nose normal. No congestion or rhinorrhea.     Mouth/Throat:     Mouth: Mucous membranes are moist.  Eyes:      General:        Right eye: No discharge.        Left eye: No discharge.     Extraocular Movements: Extraocular movements intact.     Conjunctiva/sclera: Conjunctivae normal.     Pupils: Pupils are equal, round, and reactive to light.  Cardiovascular:     Rate and Rhythm: Normal rate and regular rhythm.     Heart sounds: No murmur heard. Pulmonary:     Effort: Pulmonary effort is normal. No respiratory distress.     Breath sounds: Normal breath sounds. No wheezing, rhonchi or rales.  Abdominal:     General: Abdomen is flat. Bowel sounds are normal.  Musculoskeletal:     Cervical back: Normal range of motion and neck supple.  Skin:    General: Skin is warm and dry.     Capillary Refill: Capillary refill takes less than 2 seconds.  Neurological:     General: No focal deficit present.     Mental Status: He is alert and oriented  to person, place, and time.  Psychiatric:        Mood and Affect: Mood normal.        Behavior: Behavior normal.        Thought Content: Thought content normal.        Judgment: Judgment normal.    Results for orders placed or performed in visit on 02/03/21  HgB A1c  Result Value Ref Range   Hgb A1c MFr Bld 5.4 4.8 - 5.6 %   Est. average glucose Bld gHb Est-mCnc 108 mg/dL  CBC with Differential/Platelet  Result Value Ref Range   WBC 8.2 3.4 - 10.8 x10E3/uL   RBC 5.20 4.14 - 5.80 x10E6/uL   Hemoglobin 16.4 13.0 - 17.7 g/dL   Hematocrit 46.7 37.5 - 51.0 %   MCV 90 79 - 97 fL   MCH 31.5 26.6 - 33.0 pg   MCHC 35.1 31.5 - 35.7 g/dL   RDW 12.9 11.6 - 15.4 %   Platelets 261 150 - 450 x10E3/uL   Neutrophils 55 Not Estab. %   Lymphs 33 Not Estab. %   Monocytes 8 Not Estab. %   Eos 3 Not Estab. %   Basos 1 Not Estab. %   Neutrophils Absolute 4.5 1.4 - 7.0 x10E3/uL   Lymphocytes Absolute 2.7 0.7 - 3.1 x10E3/uL   Monocytes Absolute 0.7 0.1 - 0.9 x10E3/uL   EOS (ABSOLUTE) 0.2 0.0 - 0.4 x10E3/uL   Basophils Absolute 0.1 0.0 - 0.2 x10E3/uL   Immature  Granulocytes 0 Not Estab. %   Immature Grans (Abs) 0.0 0.0 - 0.1 x10E3/uL  Comprehensive metabolic panel  Result Value Ref Range   Glucose 76 65 - 99 mg/dL   BUN 15 6 - 24 mg/dL   Creatinine, Ser 0.99 0.76 - 1.27 mg/dL   eGFR 91 >59 mL/min/1.73   BUN/Creatinine Ratio 15 9 - 20   Sodium 139 134 - 144 mmol/L   Potassium 4.1 3.5 - 5.2 mmol/L   Chloride 99 96 - 106 mmol/L   CO2 22 20 - 29 mmol/L   Calcium 9.4 8.7 - 10.2 mg/dL   Total Protein 7.3 6.0 - 8.5 g/dL   Albumin 4.5 3.8 - 4.9 g/dL   Globulin, Total 2.8 1.5 - 4.5 g/dL   Albumin/Globulin Ratio 1.6 1.2 - 2.2   Bilirubin Total 0.3 0.0 - 1.2 mg/dL   Alkaline Phosphatase 78 44 - 121 IU/L   AST 43 (H) 0 - 40 IU/L   ALT 48 (H) 0 - 44 IU/L  Lipid Panel w/o Chol/HDL Ratio  Result Value Ref Range   Cholesterol, Total 253 (H) 100 - 199 mg/dL   Triglycerides 453 (H) 0 - 149 mg/dL   HDL 41 >39 mg/dL   VLDL Cholesterol Cal 82 (H) 5 - 40 mg/dL   LDL Chol Calc (NIH) 130 (H) 0 - 99 mg/dL  TSH  Result Value Ref Range   TSH 1.570 0.450 - 4.500 uIU/mL  PSA  Result Value Ref Range   Prostate Specific Ag, Serum 0.9 0.0 - 4.0 ng/mL  Phenobarbital level  Result Value Ref Range   Phenobarbital, Serum 12 (L) 15 - 40 ug/mL      Assessment & Plan:   Problem List Items Addressed This Visit       Other   ED (erectile dysfunction) - Primary    Chronic. Will check testosterone in the morning after patient has been off supplement for a week.  Discussed that it is important to have two low  readings before determining course of treatment. Labs ordered. Make lab appt for two weeks. Will make recommendations based on lab results.       Relevant Orders   Testosterone     Follow up plan: No follow-ups on file.

## 2021-06-30 NOTE — Assessment & Plan Note (Signed)
Chronic. Will check testosterone in the morning after patient has been off supplement for a week.  Discussed that it is important to have two low readings before determining course of treatment. Labs ordered. Make lab appt for two weeks. Will make recommendations based on lab results.

## 2021-07-10 ENCOUNTER — Ambulatory Visit: Payer: BC Managed Care – PPO | Admitting: Dermatology

## 2021-07-12 ENCOUNTER — Other Ambulatory Visit: Payer: Self-pay | Admitting: Nurse Practitioner

## 2021-07-12 NOTE — Telephone Encounter (Signed)
Requested Prescriptions  Pending Prescriptions Disp Refills   tadalafil (CIALIS) 20 MG tablet [Pharmacy Med Name: TADALAFIL 20 MG TABLET] 4 tablet 12    Sig: TAKE 1 TABLET BY MOUTH EVERY DAY AS NEEDED     Urology: Erectile Dysfunction Agents Failed - 07/12/2021  5:19 PM      Failed - Last BP in normal range    BP Readings from Last 1 Encounters:  06/30/21 (!) 141/87         Passed - Valid encounter within last 12 months    Recent Outpatient Visits          1 week ago Erectile dysfunction, unspecified erectile dysfunction type   [facility] Jon [place], NP   5 months ago Seizure disorder [facility])   [place], Henrine Screws T, NP   11 months ago Seizure disorder ([place])   [place], Barbaraann Faster, NP   1 year ago Encounter for annual physical exam   [place], Henrine Screws T, NP   1 year ago Seizure disorder [facility])   [place], Jolene T, NP      Future Appointments            In 1 month Cannady, Barbaraann Faster, NP [employer], [place]   In 2 months Ralene Bathe, MD [place]

## 2021-07-13 ENCOUNTER — Other Ambulatory Visit: Payer: BC Managed Care – PPO

## 2021-07-13 ENCOUNTER — Other Ambulatory Visit: Payer: Self-pay

## 2021-07-13 DIAGNOSIS — N529 Male erectile dysfunction, unspecified: Secondary | ICD-10-CM | POA: Diagnosis not present

## 2021-07-14 LAB — TESTOSTERONE: Testosterone: 387 ng/dL (ref 264–916)

## 2021-07-14 NOTE — Progress Notes (Signed)
Please let patient know that his testosterone was within normal limits. I recommend he speak with Jolene about this at his upcoming visit if he has further questions.

## 2021-07-17 DIAGNOSIS — H0012 Chalazion right lower eyelid: Secondary | ICD-10-CM | POA: Diagnosis not present

## 2021-08-06 NOTE — Patient Instructions (Incomplete)

## 2021-08-11 ENCOUNTER — Ambulatory Visit: Payer: BC Managed Care – PPO | Admitting: Nurse Practitioner

## 2021-08-11 DIAGNOSIS — I10 Essential (primary) hypertension: Secondary | ICD-10-CM

## 2021-08-11 DIAGNOSIS — G40909 Epilepsy, unspecified, not intractable, without status epilepticus: Secondary | ICD-10-CM

## 2021-08-11 DIAGNOSIS — E7800 Pure hypercholesterolemia, unspecified: Secondary | ICD-10-CM

## 2021-08-24 ENCOUNTER — Other Ambulatory Visit: Payer: Self-pay | Admitting: Nurse Practitioner

## 2021-08-25 NOTE — Telephone Encounter (Signed)
Requested medication (s) are due for refill today: Yes ? ?Requested medication (s) are on the active medication list: Yes ? ?Last refill:  03/03/21 ? ?Future visit scheduled: Yes ? ?Notes to clinic:  See request. ? ? ? ?Requested Prescriptions  ?Pending Prescriptions Disp Refills  ? PHENobarbital (LUMINAL) 97.2 MG tablet [Pharmacy Med Name: PHENOBARBITAL 97.2 MG TABLET] 90 tablet   ?  Sig: TAKE 1 TABLET BY MOUTH EVERYDAY AT BEDTIME  ?  ? Not Delegated - Neurology: Anticonvulsants - Controlled - phenobarbital Failed - 08/24/2021  4:59 PM  ?  ?  Failed - This refill cannot be delegated  ?  ?  Failed - Phenobarbital in normal range and within 360 days  ?  No results found for: LABPHEN, PHENOBARB  ?  ?  ?  Failed - ALT in normal range and within 360 days  ?  ALT  ?Date Value Ref Range Status  ?02/03/2021 48 (H) 0 - 44 IU/L Final  ?  ?  ?  ?  Failed - AST in normal range and within 360 days  ?  AST  ?Date Value Ref Range Status  ?02/03/2021 43 (H) 0 - 40 IU/L Final  ?  ?  ?  ?  Passed - Cr in normal range and within 360 days  ?  Creatinine, Ser  ?Date Value Ref Range Status  ?02/03/2021 0.99 0.76 - 1.27 mg/dL Final  ?  ?  ?  ?  Passed - HCT in normal range and within 360 days  ?  Hematocrit  ?Date Value Ref Range Status  ?02/03/2021 46.7 37.5 - 51.0 % Final  ?  ?  ?  ?  Passed - HGB in normal range and within 360 days  ?  Hemoglobin  ?Date Value Ref Range Status  ?02/03/2021 16.4 13.0 - 17.7 g/dL Final  ?  ?  ?  ?  Passed - WBC in normal range and within 360 days  ?  WBC  ?Date Value Ref Range Status  ?02/03/2021 8.2 3.4 - 10.8 x10E3/uL Final  ?  ?  ?  ?  Passed - PLT in normal range and within 360 days  ?  Platelets  ?Date Value Ref Range Status  ?02/03/2021 261 150 - 450 x10E3/uL Final  ?  ?  ?  ?  Passed - Completed PHQ-2 or PHQ-9 in the last 360 days  ?  ?  Passed - Patient is not pregnant  ?  ?  Passed - Valid encounter within last 12 months  ?  Recent Outpatient Visits   ? ?      ? 1 month ago Erectile dysfunction,  unspecified erectile dysfunction type  ? [place], NP  ? 6 months ago Seizure disorder [facility])  ? [place], Henrine Screws T, NP  ? 1 year ago Seizure disorder [facility])  ? [place], Henrine Screws T, NP  ? 1 year ago Encounter for annual physical exam  ? [place], Gaston T, NP  ? 2 years ago Seizure disorder [facility])  ? [facility] [place], Henrine Screws T, NP  ? ?  ?  ?Future Appointments   ? ?        ? In 1 week Venita Lick, NP [employer] [facility], [facility]  ? In 3 weeks Ralene Bathe, MD [place]  ? ?  ? ?  ?  ?  ? ?

## 2021-08-28 MED ORDER — PHENOBARBITAL 97.2 MG PO TABS: ORAL_TABLET | ORAL | 4 refills | Status: DC

## 2021-08-28 NOTE — Telephone Encounter (Signed)
Patient has appt on 3/13 ?

## 2021-08-28 NOTE — Telephone Encounter (Signed)
Patient is calling to check on the status of medication refill. Patient states that he will take his last pill tomorrow. ?

## 2021-08-28 NOTE — Addendum Note (Signed)
Addended by: Donzetta Kohut A on: 08/28/2021 04:14 PM ? ? Modules accepted: Orders ? ?

## 2021-09-02 NOTE — Patient Instructions (Signed)
DASH Eating Plan °DASH stands for Dietary Approaches to Stop Hypertension. The DASH eating plan is a healthy eating plan that has been shown to: °Reduce high blood pressure (hypertension). °Reduce your risk for type 2 diabetes, heart disease, and stroke. °Help with weight loss. °What are tips for following this plan? °Reading food labels °Check food labels for the amount of salt (sodium) per serving. Choose foods with less than 5 percent of the Daily Value of sodium. Generally, foods with less than 300 milligrams (mg) of sodium per serving fit into this eating plan. °To find whole grains, look for the word "whole" as the first word in the ingredient list. °Shopping °Buy products labeled as "low-sodium" or "no salt added." °Buy fresh foods. Avoid canned foods and pre-made or frozen meals. °Cooking °Avoid adding salt when cooking. Use salt-free seasonings or herbs instead of table salt or sea salt. Check with your health care provider or pharmacist before using salt substitutes. °Do not fry foods. Cook foods using healthy methods such as baking, boiling, grilling, roasting, and broiling instead. °Cook with heart-healthy oils, such as olive, canola, avocado, soybean, or sunflower oil. °Meal planning ° °Eat a balanced diet that includes: °4 or more servings of fruits and 4 or more servings of vegetables each day. Try to fill one-half of your plate with fruits and vegetables. °6-8 servings of whole grains each day. °Less than 6 oz (170 g) of lean meat, poultry, or fish each day. A 3-oz (85-g) serving of meat is about the same size as a deck of cards. One egg equals 1 oz (28 g). °2-3 servings of low-fat dairy each day. One serving is 1 cup (237 mL). °1 serving of nuts, seeds, or beans 5 times each week. °2-3 servings of heart-healthy fats. Healthy fats called omega-3 fatty acids are found in foods such as walnuts, flaxseeds, fortified milks, and eggs. These fats are also found in cold-water fish, such as sardines, salmon,  and mackerel. °Limit how much you eat of: °Canned or prepackaged foods. °Food that is high in trans fat, such as some fried foods. °Food that is high in saturated fat, such as fatty meat. °Desserts and other sweets, sugary drinks, and other foods with added sugar. °Full-fat dairy products. °Do not salt foods before eating. °Do not eat more than 4 egg yolks a week. °Try to eat at least 2 vegetarian meals a week. °Eat more home-cooked food and less restaurant, buffet, and fast food. °Lifestyle °When eating at a restaurant, ask that your food be prepared with less salt or no salt, if possible. °If you drink alcohol: °Limit how much you use to: °0-1 drink a day for women who are not pregnant. °0-2 drinks a day for men. °Be aware of how much alcohol is in your drink. In the [place] one drink equals one 12 oz bottle of beer (355 mL), one 5 oz glass of wine (148 mL), or one 1½ oz glass of hard liquor (44 mL). °General information °Avoid eating more than 2,300 mg of salt a day. If you have hypertension, you may need to reduce your sodium intake to 1,500 mg a day. °Work with your health care provider to maintain a healthy body weight or to lose weight. Ask what an ideal weight is for you. °Get at least 30 minutes of exercise that causes your heart to beat faster (aerobic exercise) most days of the week. Activities may include walking, swimming, or biking. °Work with your health care provider or dietitian to   adjust your eating plan to your individual calorie needs. °What foods should I eat? °Fruits °All fresh, dried, or frozen fruit. Canned fruit in natural juice (without added sugar). °Vegetables °Fresh or frozen vegetables (raw, steamed, roasted, or grilled). Low-sodium or reduced-sodium tomato and vegetable juice. Low-sodium or reduced-sodium tomato sauce and tomato paste. Low-sodium or reduced-sodium canned vegetables. °Grains °Whole-grain or whole-wheat bread. Whole-grain or whole-wheat pasta. Brown rice. Oatmeal. Quinoa.  Bulgur. Whole-grain and low-sodium cereals. Pita bread. Low-fat, low-sodium crackers. Whole-wheat flour tortillas. °Meats and other proteins °Skinless chicken or turkey. Ground chicken or turkey. Pork with fat trimmed off. Fish and seafood. Egg whites. Dried beans, peas, or lentils. Unsalted nuts, nut butters, and seeds. Unsalted canned beans. Lean cuts of beef with fat trimmed off. Low-sodium, lean precooked or cured meat, such as sausages or meat loaves. °Dairy °Low-fat (1%) or fat-free (skim) milk. Reduced-fat, low-fat, or fat-free cheeses. Nonfat, low-sodium ricotta or cottage cheese. Low-fat or nonfat yogurt. Low-fat, low-sodium cheese. °Fats and oils °Soft margarine without trans fats. Vegetable oil. Reduced-fat, low-fat, or light mayonnaise and salad dressings (reduced-sodium). Canola, safflower, olive, avocado, soybean, and sunflower oils. Avocado. °Seasonings and condiments °Herbs. Spices. Seasoning mixes without salt. °Other foods °Unsalted popcorn and pretzels. Fat-free sweets. °The items listed above may not be a complete list of foods and beverages you can eat. Contact a dietitian for more information. °What foods should I avoid? °Fruits °Canned fruit in a light or heavy syrup. Fried fruit. Fruit in cream or butter sauce. °Vegetables °Creamed or fried vegetables. Vegetables in a cheese sauce. Regular canned vegetables (not low-sodium or reduced-sodium). Regular canned tomato sauce and paste (not low-sodium or reduced-sodium). Regular tomato and vegetable juice (not low-sodium or reduced-sodium). Pickles. Olives. °Grains °Baked goods made with fat, such as croissants, muffins, or some breads. Dry pasta or rice meal packs. °Meats and other proteins °Fatty cuts of meat. Ribs. Fried meat. Bacon. Bologna, salami, and other precooked or cured meats, such as sausages or meat loaves. Fat from the back of a pig (fatback). Bratwurst. Salted nuts and seeds. Canned beans with added salt. Canned or smoked fish.  Whole eggs or egg yolks. Chicken or turkey with skin. °Dairy °Whole or 2% milk, cream, and half-and-half. Whole or full-fat cream cheese. Whole-fat or sweetened yogurt. Full-fat cheese. Nondairy creamers. Whipped toppings. Processed cheese and cheese spreads. °Fats and oils °Butter. Stick margarine. Lard. Shortening. Ghee. Bacon fat. Tropical oils, such as coconut, palm kernel, or palm oil. °Seasonings and condiments °Onion salt, garlic salt, seasoned salt, table salt, and sea salt. Worcestershire sauce. Tartar sauce. Barbecue sauce. Teriyaki sauce. Soy sauce, including reduced-sodium. Steak sauce. Canned and packaged gravies. Fish sauce. Oyster sauce. Cocktail sauce. Store-bought horseradish. Ketchup. Mustard. Meat flavorings and tenderizers. Bouillon cubes. Hot sauces. Pre-made or packaged marinades. Pre-made or packaged taco seasonings. Relishes. Regular salad dressings. °Other foods °Salted popcorn and pretzels. °The items listed above may not be a complete list of foods and beverages you should avoid. Contact a dietitian for more information. °Where to find more information °National Heart, Lung, and Blood Institute: www.nhlbi.nih.gov °American Heart Association: www.heart.org °Academy of Nutrition and Dietetics: www.eatright.org °National Kidney Foundation: www.kidney.org °Summary °The DASH eating plan is a healthy eating plan that has been shown to reduce high blood pressure (hypertension). It may also reduce your risk for type 2 diabetes, heart disease, and stroke. °When on the DASH eating plan, aim to eat more fresh fruits and vegetables, whole grains, lean proteins, low-fat dairy, and heart-healthy fats. °With the DASH   eating plan, you should limit salt (sodium) intake to 2,300 mg a day. If you have hypertension, you may need to reduce your sodium intake to 1,500 mg a day. °Work with your health care provider or dietitian to adjust your eating plan to your individual calorie needs. °This information is not  intended to replace advice given to you by your health care provider. Make sure you discuss any questions you have with your health care provider. °Document Revised: 05/15/2019 Document Reviewed: 05/15/2019 °Elsevier Patient Education © 2022 [employer] ° °

## 2021-09-04 ENCOUNTER — Ambulatory Visit: Payer: BC Managed Care – PPO | Admitting: Nurse Practitioner

## 2021-09-04 ENCOUNTER — Other Ambulatory Visit: Payer: Self-pay

## 2021-09-04 ENCOUNTER — Encounter: Payer: Self-pay | Admitting: Nurse Practitioner

## 2021-09-04 VITALS — BP 130/81 | HR 76 | Temp 98.9°F | Ht 69.0 in | Wt 199.0 lb

## 2021-09-04 DIAGNOSIS — G40909 Epilepsy, unspecified, not intractable, without status epilepticus: Secondary | ICD-10-CM | POA: Diagnosis not present

## 2021-09-04 DIAGNOSIS — I10 Essential (primary) hypertension: Secondary | ICD-10-CM | POA: Diagnosis not present

## 2021-09-04 DIAGNOSIS — E7800 Pure hypercholesterolemia, unspecified: Secondary | ICD-10-CM

## 2021-09-04 DIAGNOSIS — Z5181 Encounter for therapeutic drug level monitoring: Secondary | ICD-10-CM

## 2021-09-04 LAB — MICROALBUMIN, URINE WAIVED
Creatinine, Urine Waived: 300 mg/dL (ref 10–300)
Microalb, Ur Waived: 30 mg/L — ABNORMAL HIGH (ref 0–19)
Microalb/Creat Ratio: <30 mg/g (ref <30)

## 2021-09-04 MED ORDER — PHENOBARBITAL 97.2 MG PO TABS
ORAL_TABLET | ORAL | 4 refills | Status: DC
Start: 2021-09-04 — End: 2022-03-22

## 2021-09-04 NOTE — Progress Notes (Signed)
? ?BP 130/81 (BP Location: Left Arm, Cuff Size: Normal)   Pulse 76   Temp 98.9 ?F (37.2 ?C) (Oral)   Ht '5\' 9"'$  (1.753 m)   Wt 199 lb (90.3 kg)   SpO2 97%   BMI 29.39 kg/m?   ? ?Subjective:  ? ? Patient ID: Dillon Blake, male    DOB: 09/13/1965, 56 y.o.   MRN: [id number] ? ?HPI: ?Dillon Blake is a 56 y.o. male ? ?Chief Complaint  ?Patient presents with  ? Seizures  ? Hyperlipidemia  ? Hypertension  ? ?HYPERTENSION ?Continues on Lisinopril 5 MG daily.   ?Hypertension status: controlled  ?Satisfied with current treatment? yes ?Duration of hypertension: chronic ?BP monitoring frequency:  occasionally ?BP range: 130/80 range ?BP medication side effects:  no ?Medication compliance: good compliance ?Aspirin: no ?Recurrent headaches: no ?Visual changes: no ?Palpitations: no ?Dyspnea: no ?Chest pain: no ?Lower extremity edema: no ?Dizzy/lightheaded: no  ?The 10-year ASCVD risk score (Arnett DK, et al., 2019) is: 9% ?  Values used to calculate the score: ?    Age: 57 years ?    Sex: Male ?    Is Non-Hispanic African American: No ?    Diabetic: No ?    Tobacco smoker: No ?    Systolic Blood Pressure: 841 mmHg ?    Is BP treated: Yes ?    HDL Cholesterol: 41 mg/dL ?    Total Cholesterol: 253 mg/dL ? ? ?SEIZURE DISORDER: ?Last phenobarbital level was August = 12.  Continues on 97.2 MG at bedtime, been on this for many years.  He is taking 1 1/2 tablets of Phenobarbital, to help with sleep. Last seizure was in April 1991, first one was June of 1987.  Has only had two seizures in his life.  Did see neurology years ago and they did sleep testing, he reports they found that when he had lack of sleep this led to seizures.  So he stays on adequate sleep regimen at home.  PDMP review last fill 08/29/21, no other controlled substances noted. ?  ?Relevant past medical, surgical, family and social history reviewed and updated as indicated. Interim medical history since our last visit reviewed. ?Allergies and medications  reviewed and updated. ? ?Review of Systems  ?Constitutional:  Negative for activity change, diaphoresis, fatigue and fever.  ?Respiratory:  Negative for cough, chest tightness, shortness of breath and wheezing.   ?Cardiovascular:  Negative for chest pain, palpitations and leg swelling.  ?Gastrointestinal: Negative.  Negative for nausea.  ?Psychiatric/Behavioral: Negative.    ? ?Per HPI unless specifically indicated above ? ?   ?Objective:  ?  ?BP 130/81 (BP Location: Left Arm, Cuff Size: Normal)   Pulse 76   Temp 98.9 ?F (37.2 ?C) (Oral)   Ht '5\' 9"'$  (1.753 m)   Wt 199 lb (90.3 kg)   SpO2 97%   BMI 29.39 kg/m?   ?Wt Readings from Last 3 Encounters:  ?09/04/21 199 lb (90.3 kg)  ?06/30/21 203 lb (92.1 kg)  ?02/03/21 207 lb 12.8 oz (94.3 kg)  ?  ?Physical Exam ?Vitals and nursing note reviewed.  ?Constitutional:   ?   General: He is awake. He is not in acute distress. ?   Appearance: He is well-developed and well-groomed. He is not ill-appearing.  ?HENT:  ?   Head: Normocephalic and atraumatic.  ?   Right Ear: Hearing normal. No drainage.  ?   Left Ear: Hearing normal. No drainage.  ?Eyes:  ?   General:  Lids are normal.     ?   Right eye: No discharge.     ?   Left eye: No discharge.  ?   Conjunctiva/sclera: Conjunctivae normal.  ?   Pupils: Pupils are equal, round, and reactive to light.  ?Neck:  ?   Thyroid: No thyromegaly.  ?   Vascular: No carotid bruit.  ?   Trachea: Trachea normal.  ?Cardiovascular:  ?   Rate and Rhythm: Normal rate and regular rhythm.  ?   Heart sounds: Normal heart sounds, S1 normal and S2 normal. No murmur heard. ?  No gallop.  ?Pulmonary:  ?   Effort: Pulmonary effort is normal. No accessory muscle usage or respiratory distress.  ?   Breath sounds: Normal breath sounds.  ?Abdominal:  ?   General: Bowel sounds are normal.  ?   Palpations: Abdomen is soft.  ?Musculoskeletal:     ?   General: Normal range of motion.  ?   Cervical back: Normal range of motion and neck supple. No erythema. No  pain with movement, spinous process tenderness or muscular tenderness. Normal range of motion.  ?   Right lower leg: No edema.  ?   Left lower leg: No edema.  ?Skin: ?   General: Skin is warm and dry.  ?   Capillary Refill: Capillary refill takes less than 2 seconds.  ?   Findings: No rash.  ?Neurological:  ?   Mental Status: He is alert and oriented to person, place, and time.  ?   Deep Tendon Reflexes: Reflexes are normal and symmetric.  ?   Reflex Scores: ?     Brachioradialis reflexes are 2+ on the right side and 2+ on the left side. ?     Patellar reflexes are 2+ on the right side and 2+ on the left side. ?Psychiatric:     ?   Attention and Perception: Attention normal.     ?   Mood and Affect: Mood normal.     ?   Speech: Speech normal.     ?   Behavior: Behavior normal. Behavior is cooperative.     ?   Thought Content: Thought content normal.  ? ?Results for orders placed or performed in visit on 06/30/21  ?Testosterone  ?Result Value Ref Range  ? Testosterone 387 264 - 916 ng/dL  ? ?   ?Assessment & Plan:  ? ?Problem List Items Addressed This Visit   ? ?  ? Cardiovascular and Mediastinum  ? Hypertension  ?  Chronic, ongoing with BP at goal today.  Continue current medication regimen and adjust as needed.  Recommend he check BP at home at least three mornings a week.  BMP and urine ALB today.  Focus on DASH diet at home.  Return in 6 months. ?  ?  ? Relevant Orders  ? Basic metabolic panel  ? Microalbumin, Urine Waived  ?  ? Nervous and Auditory  ? Seizure disorder ([place]) - Primary  ?  Chronic, stable with no seizure since 1991.  Continue current medication regimen and check phenobarbital level today -- will increase this dosage to 1 1/2 tablets as he is often taking this and remaining stable with better sleep pattern.  Referral to neuro if any worsening seizures present.  Return in 6 months. ?  ?  ? Relevant Medications  ? PHENobarbital (LUMINAL) 97.2 MG tablet  ? Other Relevant Orders  ? Phenobarbital level   ?  ? Other  ?  Elevated LDL cholesterol level  ?  Continue diet focus at this time, ASCVD 9%, recheck lipid panel today. ?  ?  ? Relevant Orders  ? Lipid Panel w/o Chol/HDL Ratio  ? Medication monitoring encounter  ?  Check Phenobarbital level ?  ?  ? Relevant Orders  ? Phenobarbital level  ?  ? ?Follow up plan: ?Return in about 6 months (around 03/07/2022) for Annual physical. ?

## 2021-09-04 NOTE — Assessment & Plan Note (Signed)
Check Phenobarbital level ?

## 2021-09-04 NOTE — Assessment & Plan Note (Signed)
Chronic, stable with no seizure since 1991.  Continue current medication regimen and check phenobarbital level today -- will increase this dosage to 1 1/2 tablets as he is often taking this and remaining stable with better sleep pattern.  Referral to neuro if any worsening seizures present.  Return in 6 months. ?

## 2021-09-04 NOTE — Assessment & Plan Note (Signed)
Chronic, ongoing with BP at goal today.  Continue current medication regimen and adjust as needed.  Recommend he check BP at home at least three mornings a week.  BMP and urine ALB today.  Focus on DASH diet at home.  Return in 6 months. ?

## 2021-09-04 NOTE — Assessment & Plan Note (Signed)
Continue diet focus at this time, ASCVD 9%, recheck lipid panel today. ?

## 2021-09-05 ENCOUNTER — Encounter: Payer: Self-pay | Admitting: Nurse Practitioner

## 2021-09-05 LAB — LIPID PANEL W/O CHOL/HDL RATIO
Cholesterol, Total: 240 mg/dL — ABNORMAL HIGH (ref 100–199)
HDL: 70 mg/dL (ref >39)
LDL Chol Calc (NIH): 151 mg/dL — ABNORMAL HIGH (ref 0–99)
Triglycerides: 108 mg/dL (ref 0–149)
VLDL Cholesterol Cal: 19 mg/dL (ref 5–40)

## 2021-09-05 LAB — BASIC METABOLIC PANEL
BUN/Creatinine Ratio: 18 (ref 9–20)
BUN: 19 mg/dL (ref 6–24)
CO2: 25 mmol/L (ref 20–29)
Calcium: 9.8 mg/dL (ref 8.7–10.2)
Chloride: 99 mmol/L (ref 96–106)
Creatinine, Ser: 1.03 mg/dL (ref 0.76–1.27)
Glucose: 99 mg/dL (ref 70–99)
Potassium: 4.1 mmol/L (ref 3.5–5.2)
Sodium: 139 mmol/L (ref 134–144)
eGFR: 87 mL/min/{1.73_m2} (ref >59)

## 2021-09-05 LAB — PHENOBARBITAL LEVEL: Phenobarbital, Serum: 16 ug/mL (ref 15–40)

## 2021-09-05 NOTE — Progress Notes (Signed)
Contacted via MyChart ? ? ? ?Good afternoon Dillon Blake, labs continue to show normal kidney and liver function.  Phenobarbital level is normal.  Your cholesterol is still high, but continued recommendations to make lifestyle changes. Your LDL is above normal. ?The LDL is the bad cholesterol. ?Over time and in combination with inflammation and other factors, this contributes to plaque which in turn may lead to stroke and/or heart attack down the road. ?Sometimes high LDL is primarily genetic, and people might be eating all the right foods but still have high numbers. ?Other times, there is room for improvement in one's diet and eating healthier can bring this number down and potentially reduce one's risk of heart attack and/or stroke.  ?? ?To reduce your LDL, Remember - more fruits and vegetables, more fish, and limit red meat and dairy products. ?More soy, nuts, beans, barley, lentils, oats and plant sterol ester enriched margarine instead of butter. ?I also encourage eliminating sugar and processed food. ?Remember, shop on the outside of the grocery store and visit your [employer]. ??If you would like to talk with me about dietary changes plus or minus medications for your cholesterol, please let me know. We should recheck your cholesterol in 6 months.  Any questions? ?Keep being amazing!!  Thank you for allowing me to participate in your care.  I appreciate you. ?Kindest regards, ?Dillon Blake ? ?? ?

## 2021-09-20 ENCOUNTER — Ambulatory Visit: Payer: BC Managed Care – PPO | Admitting: Dermatology

## 2022-02-07 ENCOUNTER — Other Ambulatory Visit: Payer: Self-pay | Admitting: Nurse Practitioner

## 2022-02-08 NOTE — Telephone Encounter (Signed)
Requested medication (s) are due for refill today:   Yes  Requested medication (s) are on the active medication list:   Yes  Future visit scheduled:   Yes   Last ordered: 07/12/2021 #4, 12 refills  Returned because liver enzymes are due per protocol    Requested Prescriptions  Pending Prescriptions Disp Refills   tadalafil (CIALIS) 20 MG tablet [Pharmacy Med Name: TADALAFIL 20 MG TABLET] 4 tablet 12    Sig: TAKE 1 TABLET BY MOUTH EVERY DAY AS NEEDED     Urology: Erectile Dysfunction Agents Failed - 02/07/2022  5:01 PM      Failed - AST in normal range and within 360 days    AST  Date Value Ref Range Status  02/03/2021 43 (H) 0 - 40 IU/L Final         Failed - ALT in normal range and within 360 days    ALT  Date Value Ref Range Status  02/03/2021 48 (H) 0 - 44 IU/L Final         Passed - Last BP in normal range    BP Readings from Last 1 Encounters:  09/04/21 130/81         Passed - Valid encounter within last 12 months    Recent Outpatient Visits           5 months ago Seizure disorder ([place])   [place], [place] T, NP   7 months ago Erectile dysfunction, unspecified erectile dysfunction type   [facility] Jon Billings, NP   1 year ago Seizure disorder [facility])   [place], Barbaraann Faster, NP   1 year ago Seizure disorder ([place])   [place], Barbaraann Faster, NP   2 years ago Encounter for annual physical exam   [place], Barbaraann Faster, NP       Future Appointments             In 4 weeks Venita Lick, NP [employer], [place]   In 1 month Ralene Bathe, MD [place]

## 2022-02-20 ENCOUNTER — Other Ambulatory Visit: Payer: Self-pay | Admitting: Nurse Practitioner

## 2022-02-20 NOTE — Telephone Encounter (Signed)
Requested medication (s) are due for refill today - expired Rx  Requested medication (s) are on the active medication list -yes  Future visit scheduled -yes  Last refill: 02/03/21 #90 4RF  Notes to clinic: expired Rx  Requested Prescriptions  Pending Prescriptions Disp Refills   lisinopril (ZESTRIL) 5 MG tablet [Pharmacy Med Name: LISINOPRIL 5 MG TABLET] 90 tablet 4    Sig: TAKE 1 TABLET BY MOUTH EVERY DAY.     Cardiovascular:  ACE Inhibitors Passed - 02/20/2022  2:29 AM      Passed - Cr in normal range and within 180 days    Creatinine, Ser  Date Value Ref Range Status  09/04/2021 1.03 0.76 - 1.27 mg/dL Final         Passed - K in normal range and within 180 days    Potassium  Date Value Ref Range Status  09/04/2021 4.1 3.5 - 5.2 mmol/L Final         Passed - Patient is not pregnant      Passed - Last BP in normal range    BP Readings from Last 1 Encounters:  09/04/21 130/81         Passed - Valid encounter within last 6 months    Recent Outpatient Visits           5 months ago Seizure disorder ([place])   [place], [place] T, NP   7 months ago Erectile dysfunction, unspecified erectile dysfunction type   [facility] Jon Billings, NP   1 year ago Seizure disorder ([place])   [place], Barbaraann Faster, NP   1 year ago Seizure disorder ([place])   [place], Henrine [place] T, NP   2 years ago Encounter for annual physical exam   [place], Barbaraann Faster, NP       Future Appointments             In 2 weeks Venita Lick, NP [employer] [facility], [place]   In 1 month Ralene Bathe, MD [place]               Requested Prescriptions  Pending Prescriptions Disp Refills   lisinopril (ZESTRIL) 5 MG tablet [Pharmacy Med Name: LISINOPRIL 5 MG TABLET] 90 tablet 4    Sig: TAKE 1 TABLET BY MOUTH EVERY DAY.     Cardiovascular:  ACE Inhibitors Passed - 02/20/2022   2:29 AM      Passed - Cr in normal range and within 180 days    Creatinine, Ser  Date Value Ref Range Status  09/04/2021 1.03 0.76 - 1.27 mg/dL Final         Passed - K in normal range and within 180 days    Potassium  Date Value Ref Range Status  09/04/2021 4.1 3.5 - 5.2 mmol/L Final         Passed - Patient is not pregnant      Passed - Last BP in normal range    BP Readings from Last 1 Encounters:  09/04/21 130/81         Passed - Valid encounter within last 6 months    Recent Outpatient Visits           5 months ago Seizure disorder [facility])   [place], [place] T, NP   7 months ago Erectile dysfunction, unspecified erectile dysfunction type   [facility] Jon Billings, NP   1  year ago Seizure disorder [facility])   [place], Barbaraann Faster, NP   1 year ago Seizure disorder [facility])   [place], Barbaraann Faster, NP   2 years ago Encounter for annual physical exam   [place], Jolene T, NP       Future Appointments             In 2 weeks Venita Lick, NP [employer] [facility], [place]   In 1 month Ralene Bathe, MD [place]

## 2022-03-09 ENCOUNTER — Encounter: Payer: BC Managed Care – PPO | Admitting: Nurse Practitioner

## 2022-03-09 DIAGNOSIS — G40909 Epilepsy, unspecified, not intractable, without status epilepticus: Secondary | ICD-10-CM

## 2022-03-09 DIAGNOSIS — Z833 Family history of diabetes mellitus: Secondary | ICD-10-CM

## 2022-03-09 DIAGNOSIS — Z5181 Encounter for therapeutic drug level monitoring: Secondary | ICD-10-CM

## 2022-03-09 DIAGNOSIS — N400 Enlarged prostate without lower urinary tract symptoms: Secondary | ICD-10-CM

## 2022-03-09 DIAGNOSIS — E7800 Pure hypercholesterolemia, unspecified: Secondary | ICD-10-CM

## 2022-03-09 DIAGNOSIS — I10 Essential (primary) hypertension: Secondary | ICD-10-CM

## 2022-03-09 DIAGNOSIS — Z0000 Encounter for general adult medical examination without abnormal findings: Secondary | ICD-10-CM

## 2022-03-21 ENCOUNTER — Other Ambulatory Visit: Payer: Self-pay | Admitting: Nurse Practitioner

## 2022-03-21 NOTE — Telephone Encounter (Signed)
Requested medication (s) are due for refill today: No  Requested medication (s) are on the active medication list: yes    Last refill: 09/04/21  #270 4 refills  Future visit scheduled yes 04/24/22  Notes to clinic:Not delegated, please review. Thank you.  Sent via Insurance risk surveyor Prescriptions  Pending Prescriptions Disp Refills   PHENobarbital (LUMINAL) 97.2 MG tablet [Pharmacy Med Name: PHENOBARBITAL 97.2 MG TABLET] 135 tablet     Sig: TAKE 1 AND 1/2 TABS AT BEDTIME     Not Delegated - Neurology: Anticonvulsants - Controlled - phenobarbital Failed - 03/21/2022  5:41 PM      Failed - This refill cannot be delegated      Failed - ALT in normal range and within 360 days    ALT  Date Value Ref Range Status  02/03/2021 48 (H) 0 - 44 IU/L Final         Failed - AST in normal range and within 360 days    AST  Date Value Ref Range Status  02/03/2021 43 (H) 0 - 40 IU/L Final         Failed - HCT in normal range and within 360 days    Hematocrit  Date Value Ref Range Status  02/03/2021 46.7 37.5 - 51.0 % Final         Failed - HGB in normal range and within 360 days    Hemoglobin  Date Value Ref Range Status  02/03/2021 16.4 13.0 - 17.7 g/dL Final         Failed - WBC in normal range and within 360 days    WBC  Date Value Ref Range Status  02/03/2021 8.2 3.4 - 10.8 x10E3/uL Final         Failed - PLT in normal range and within 360 days    Platelets  Date Value Ref Range Status  02/03/2021 261 150 - 450 x10E3/uL Final         Passed - Phenobarbital in normal range and within 360 days    No results found for: "LABPHEN", "PHENOBARB"       Passed - Cr in normal range and within 360 days    Creatinine, Ser  Date Value Ref Range Status  09/04/2021 1.03 0.76 - 1.27 mg/dL Final         Passed - Completed PHQ-2 or PHQ-9 in the last 360 days      Passed - Patient is not pregnant      Passed - Valid encounter within last 12 months    Recent Outpatient Visits            6 months ago Seizure disorder ([place])   [place], Jolene T, NP   8 months ago Erectile dysfunction, unspecified erectile dysfunction type   [facility] Jon Billings, NP   1 year ago Seizure disorder ([place])   [place], Barbaraann Faster, NP   1 year ago Seizure disorder ([place])   [place], Barbaraann Faster, NP   2 years ago Encounter for annual physical exam   [place], Jolene T, NP       Future Appointments             In 5 days Ralene Bathe, MD [place]   In 1 month [place], Barbaraann Faster, NP [facility]

## 2022-03-26 ENCOUNTER — Ambulatory Visit: Payer: BC Managed Care – PPO | Admitting: Dermatology

## 2022-04-11 ENCOUNTER — Encounter: Payer: BC Managed Care – PPO | Admitting: Nurse Practitioner

## 2022-04-24 ENCOUNTER — Encounter: Payer: BC Managed Care – PPO | Admitting: Nurse Practitioner

## 2022-06-03 NOTE — Patient Instructions (Incomplete)

## 2022-06-05 ENCOUNTER — Encounter: Payer: BC Managed Care – PPO | Admitting: Nurse Practitioner

## 2022-06-05 DIAGNOSIS — G40909 Epilepsy, unspecified, not intractable, without status epilepticus: Secondary | ICD-10-CM

## 2022-06-05 DIAGNOSIS — Z5181 Encounter for therapeutic drug level monitoring: Secondary | ICD-10-CM

## 2022-06-05 DIAGNOSIS — E7800 Pure hypercholesterolemia, unspecified: Secondary | ICD-10-CM

## 2022-06-05 DIAGNOSIS — N400 Enlarged prostate without lower urinary tract symptoms: Secondary | ICD-10-CM

## 2022-06-05 DIAGNOSIS — Z0000 Encounter for general adult medical examination without abnormal findings: Secondary | ICD-10-CM

## 2022-06-05 DIAGNOSIS — I10 Essential (primary) hypertension: Secondary | ICD-10-CM

## 2022-06-05 DIAGNOSIS — Z833 Family history of diabetes mellitus: Secondary | ICD-10-CM

## 2022-06-17 NOTE — Patient Instructions (Incomplete)

## 2022-06-19 ENCOUNTER — Encounter: Payer: Self-pay | Admitting: Nurse Practitioner

## 2022-06-19 ENCOUNTER — Encounter: Payer: BC Managed Care – PPO | Admitting: Nurse Practitioner

## 2022-06-19 DIAGNOSIS — E7800 Pure hypercholesterolemia, unspecified: Secondary | ICD-10-CM

## 2022-06-19 DIAGNOSIS — G40909 Epilepsy, unspecified, not intractable, without status epilepticus: Secondary | ICD-10-CM

## 2022-06-19 DIAGNOSIS — Z5181 Encounter for therapeutic drug level monitoring: Secondary | ICD-10-CM

## 2022-06-19 DIAGNOSIS — N400 Enlarged prostate without lower urinary tract symptoms: Secondary | ICD-10-CM

## 2022-06-19 DIAGNOSIS — Z0000 Encounter for general adult medical examination without abnormal findings: Secondary | ICD-10-CM

## 2022-06-19 DIAGNOSIS — I10 Essential (primary) hypertension: Secondary | ICD-10-CM

## 2022-06-19 DIAGNOSIS — Z833 Family history of diabetes mellitus: Secondary | ICD-10-CM

## 2022-06-27 ENCOUNTER — Ambulatory Visit: Payer: BC Managed Care – PPO | Admitting: Dermatology

## 2022-06-27 VITALS — BP 160/101 | HR 69

## 2022-06-27 DIAGNOSIS — L578 Other skin changes due to chronic exposure to nonionizing radiation: Secondary | ICD-10-CM

## 2022-06-27 DIAGNOSIS — Z1283 Encounter for screening for malignant neoplasm of skin: Secondary | ICD-10-CM

## 2022-06-27 DIAGNOSIS — L814 Other melanin hyperpigmentation: Secondary | ICD-10-CM | POA: Diagnosis not present

## 2022-06-27 DIAGNOSIS — L918 Other hypertrophic disorders of the skin: Secondary | ICD-10-CM | POA: Diagnosis not present

## 2022-06-27 DIAGNOSIS — L820 Inflamed seborrheic keratosis: Secondary | ICD-10-CM | POA: Diagnosis not present

## 2022-06-27 DIAGNOSIS — D229 Melanocytic nevi, unspecified: Secondary | ICD-10-CM

## 2022-06-27 DIAGNOSIS — L821 Other seborrheic keratosis: Secondary | ICD-10-CM

## 2022-06-27 NOTE — Patient Instructions (Addendum)
Cryotherapy Aftercare  Wash gently with soap and water everyday.   Apply Vaseline and Band-Aid daily until healed.     Due to recent changes in healthcare laws, you may see results of your pathology and/or laboratory studies on MyChart before the doctors have had a chance to review them. We understand that in some cases there may be results that are confusing or concerning to you. Please understand that not all results are received at the same time and often the doctors may need to interpret multiple results in order to provide you with the best plan of care or course of treatment. Therefore, we ask that you please give us 2 business days to thoroughly review all your results before contacting the office for clarification. Should we see a critical lab result, you will be contacted sooner.   If You Need Anything After Your Visit  If you have any questions or concerns for your doctor, please call our main line at [phone] and press option 4 to reach your doctor's medical assistant. If no one answers, please leave a voicemail as directed and we will return your call as soon as possible. Messages left after 4 pm will be answered the following business day.   You may also send us a message via MyChart. We typically respond to MyChart messages within 1-2 business days.  For prescription refills, please ask your pharmacy to contact our office. Our fax number is [phone].  If you have an urgent issue when the clinic is closed that cannot wait until the next business day, you can page your doctor at the number below.    Please note that while we do our best to be available for urgent issues outside of office hours, we are not available 24/7.   If you have an urgent issue and are unable to reach us, you may choose to seek medical care at your doctor's office, retail clinic, urgent care center, or emergency room.  If you have a medical emergency, please immediately call 911 or go to the  emergency department.  Pager Numbers  - Dr. Kowalski: [phone]  - Dr. Moye: [phone]  - Dr. Stewart: [phone]  In the event of inclement weather, please call our main line at [phone] for an update on the status of any delays or closures.  Dermatology Medication Tips: Please keep the boxes that topical medications come in in order to help keep track of the instructions about where and how to use these. Pharmacies typically print the medication instructions only on the boxes and not directly on the medication tubes.   If your medication is too expensive, please contact our office at [phone] option 4 or send us a message through MyChart.   We are unable to tell what your co-pay for medications will be in advance as this is different depending on your insurance coverage. However, we may be able to find a substitute medication at lower cost or fill out paperwork to get insurance to cover a needed medication.   If a prior authorization is required to get your medication covered by your insurance company, please allow us 1-2 business days to complete this process.  Drug prices often vary depending on where the prescription is filled and some pharmacies may offer cheaper prices.  The website www.goodrx.com contains coupons for medications through different pharmacies. The prices here do not account for what the cost may be with help from insurance (it may be cheaper with your insurance), but the website can   give you the price if you did not use any insurance.  - You can print the associated coupon and take it with your prescription to the pharmacy.  - You may also stop by our office during regular business hours and pick up a GoodRx coupon card.  - If you need your prescription sent electronically to a different pharmacy, notify our office through [place] MyChart or by phone at [phone] option 4.     Si Usted Necesita Algo Despus de Su Visita  Tambin puede  enviarnos un mensaje a travs de MyChart. Por lo general respondemos a los mensajes de MyChart en el transcurso de 1 a 2 das hbiles.  Para renovar recetas, por favor pida a su farmacia que se ponga en contacto con nuestra oficina. Nuestro nmero de fax es el [phone].  Si tiene un asunto urgente cuando la clnica est cerrada y que no puede esperar hasta el siguiente da hbil, puede llamar/localizar a su doctor(a) al nmero que aparece a continuacin.   Por favor, tenga en cuenta que aunque hacemos todo lo posible para estar disponibles para asuntos urgentes fuera del horario de oficina, no estamos disponibles las 24 horas del da, los 7 das de la semana.   Si tiene un problema urgente y no puede comunicarse con nosotros, puede optar por buscar atencin mdica  en el consultorio de su doctor(a), en una clnica privada, en un centro de atencin urgente o en una sala de emergencias.  Si tiene una emergencia mdica, por favor llame inmediatamente al 911 o vaya a la sala de emergencias.  Nmeros de bper  - Dr. Kowalski: [phone]  - Dra. Moye: [phone]  - Dra. Stewart: [phone]  En caso de inclemencias del tiempo, por favor llame a nuestra lnea principal al [phone] para una actualizacin sobre el estado de cualquier retraso o cierre.  Consejos para la medicacin en dermatologa: Por favor, guarde las cajas en las que vienen los medicamentos de uso tpico para ayudarle a seguir las instrucciones sobre dnde y cmo usarlos. Las farmacias generalmente imprimen las instrucciones del medicamento slo en las cajas y no directamente en los tubos del medicamento.   Si su medicamento es muy caro, por favor, pngase en contacto con nuestra oficina llamando al [phone] y presione la opcin 4 o envenos un mensaje a travs de MyChart.   No podemos decirle cul ser su copago por los medicamentos por adelantado ya que esto es diferente dependiendo de la cobertura de su seguro.  Sin embargo, es posible que podamos encontrar un medicamento sustituto a menor costo o llenar un formulario para que el seguro cubra el medicamento que se considera necesario.   Si se requiere una autorizacin previa para que su compaa de seguros cubra su medicamento, por favor permtanos de 1 a 2 das hbiles para completar este proceso.  Los precios de los medicamentos varan con frecuencia dependiendo del lugar de dnde se surte la receta y alguna farmacias pueden ofrecer precios ms baratos.  El sitio web www.goodrx.com tiene cupones para medicamentos de diferentes farmacias. Los precios aqu no tienen en cuenta lo que podra costar con la ayuda del seguro (puede ser ms barato con su seguro), pero el sitio web puede darle el precio si no utiliz ningn seguro.  - Puede imprimir el cupn correspondiente y llevarlo con su receta a la farmacia.  - Tambin puede pasar por nuestra oficina durante el horario de atencin regular y recoger una tarjeta de cupones de GoodRx.  -   Si necesita que su receta se enve electrnicamente a una farmacia diferente, informe a nuestra oficina a travs de MyChart de [place] o por telfono llamando al [phone] y presione la opcin 4.  

## 2022-06-27 NOTE — Progress Notes (Unsigned)
New Patient Visit  Subjective  Dillon Blake is a 57 y.o. male who presents for the following: Total body skin exam (No hx of skin cancer, ) and skin tags (Bil axilla, irritated, painflu).  New patient referral from Marnee Guarneri, NP.  The patient presents for Total-Body Skin Exam (TBSE) for skin cancer screening and mole check.  The patient has spots, moles and lesions to be evaluated, some may be new or changing and the patient has concerns that these could be cancer.   The following portions of the chart were reviewed this encounter and updated as appropriate:   Tobacco  Allergies  Meds  Problems  Med Hx  Surg Hx  Fam Hx     Review of Systems:  No other skin or systemic complaints except as noted in HPI or Assessment and Plan.  Objective  Well appearing patient in no apparent distress; mood and affect are within normal limits.  A full examination was performed including scalp, head, eyes, ears, nose, lips, neck, chest, axillae, abdomen, back, buttocks, bilateral upper extremities, bilateral lower extremities, hands, feet, fingers, toes, fingernails, and toenails. All findings within normal limits unless otherwise noted below.  Scalp x 1 Stuck on waxy paps with erythema  neck, bil axilla Fleshy, skin-colored pedunculated papules.     Assessment & Plan   Lentigines - Scattered tan macules - Due to sun exposure - Benign-appearing, observe - Recommend daily broad spectrum sunscreen SPF 30+ to sun-exposed areas, reapply every 2 hours as needed. - Call for any changes - upper back  Seborrheic Keratoses - Stuck-on, waxy, tan-brown papules and/or plaques  - Benign-appearing - Discussed benign etiology and prognosis. - Observe - Call for any changes - trunk  Melanocytic Nevi - Tan-brown and/or pink-flesh-colored symmetric macules and papules - Benign appearing on exam today - Observation - Call clinic for new or changing moles - Recommend daily use of broad  spectrum spf 30+ sunscreen to sun-exposed areas.  - trunk  Hemangiomas - Red papules - Discussed benign nature - Observe - Call for any changes - trunk  Actinic Damage - Chronic condition, secondary to cumulative UV/sun exposure - diffuse scaly erythematous macules with underlying dyspigmentation - Recommend daily broad spectrum sunscreen SPF 30+ to sun-exposed areas, reapply every 2 hours as needed.  - Staying in the shade or wearing long sleeves, sun glasses (UVA+UVB protection) and wide brim hats (4-inch brim around the entire circumference of the hat) are also recommended for sun protection.  - Call for new or changing lesions. - scalp  Sebaceous Hyperplasia - Small yellow papules with a central dell - Benign - Observe  - face  Skin cancer screening performed today.   Inflamed seborrheic keratosis Scalp x 1 Symptomatic, irritating, patient would like treated. Destruction of lesion - Scalp x 1 Complexity: simple   Destruction method: cryotherapy   Informed consent: discussed and consent obtained   Timeout:  patient name, date of birth, surgical site, and procedure verified Lesion destroyed using liquid nitrogen: Yes   Region frozen until ice ball extended beyond lesion: Yes   Outcome: patient tolerated procedure well with no complications   Post-procedure details: wound care instructions given    Skin tag neck, bil axilla Discussed treating, advised typically commercial insurance will not cover If cosmetically treated will cost $115 for up to 15, then each additional 10 treated would be an additional $20 Patient will schedule to have treated  Return for to be scheduled for cometic skin tag removal.  I, Othelia Pulling, RMA, am acting as scribe for Sarina Ser, MD . Documentation: I have reviewed the above documentation for accuracy and completeness, and I agree with the above.  Sarina Ser, MD

## 2022-06-28 ENCOUNTER — Encounter: Payer: Self-pay | Admitting: Dermatology

## 2022-07-18 IMAGING — CR DG RIBS W/ CHEST 3+V*L*
5 series · 6 of 6 positions shown · non-contrast
Comparison: None.

CLINICAL DATA: Pain

EXAM:
LEFT RIBS AND CHEST - 3+ VIEW

[chest pa]
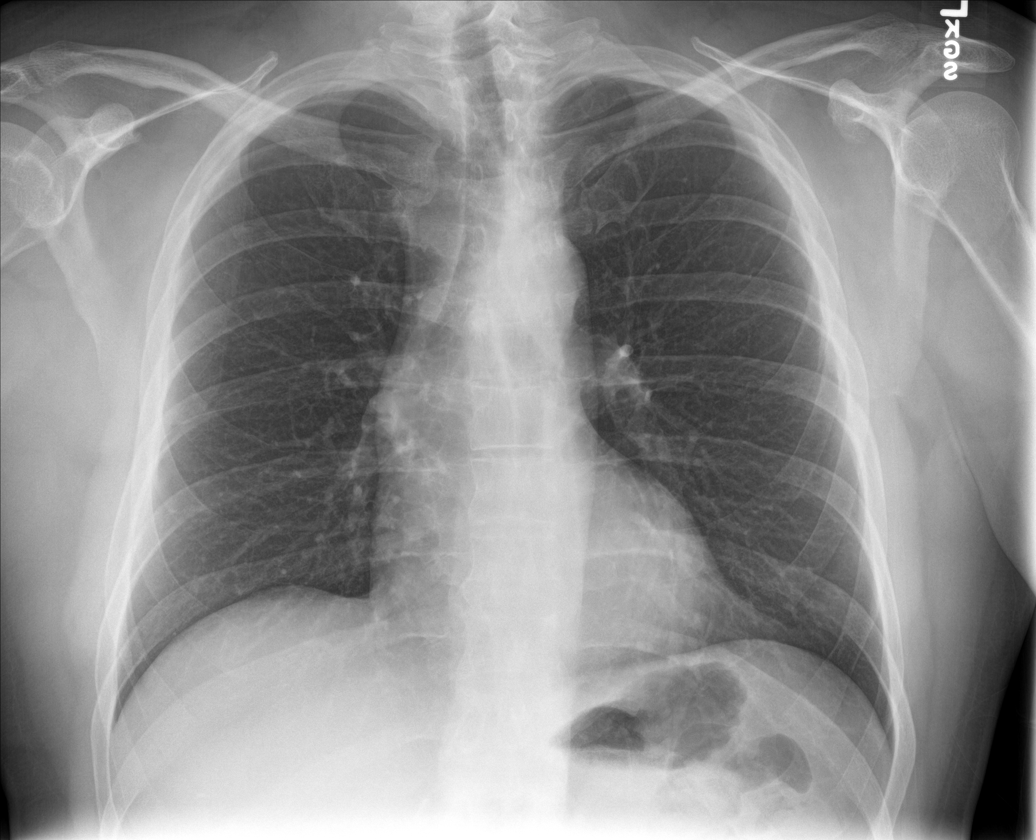

[rib pa (1 of 2)]
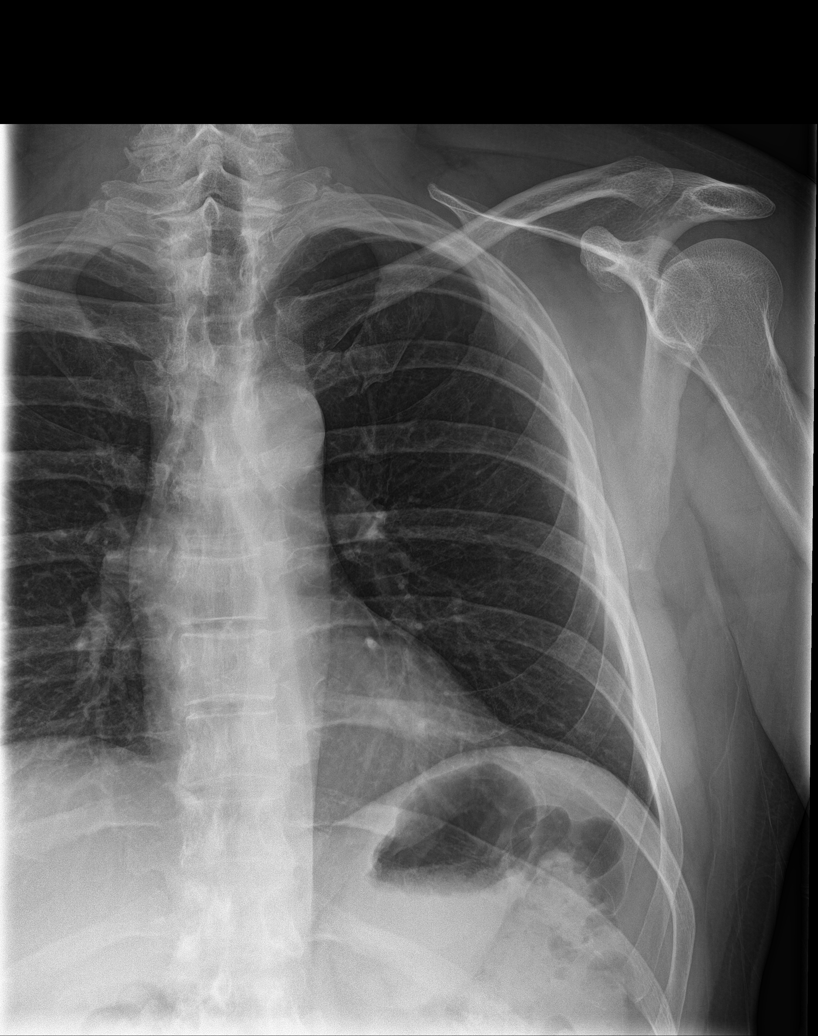

[rib pa (2 of 2)]
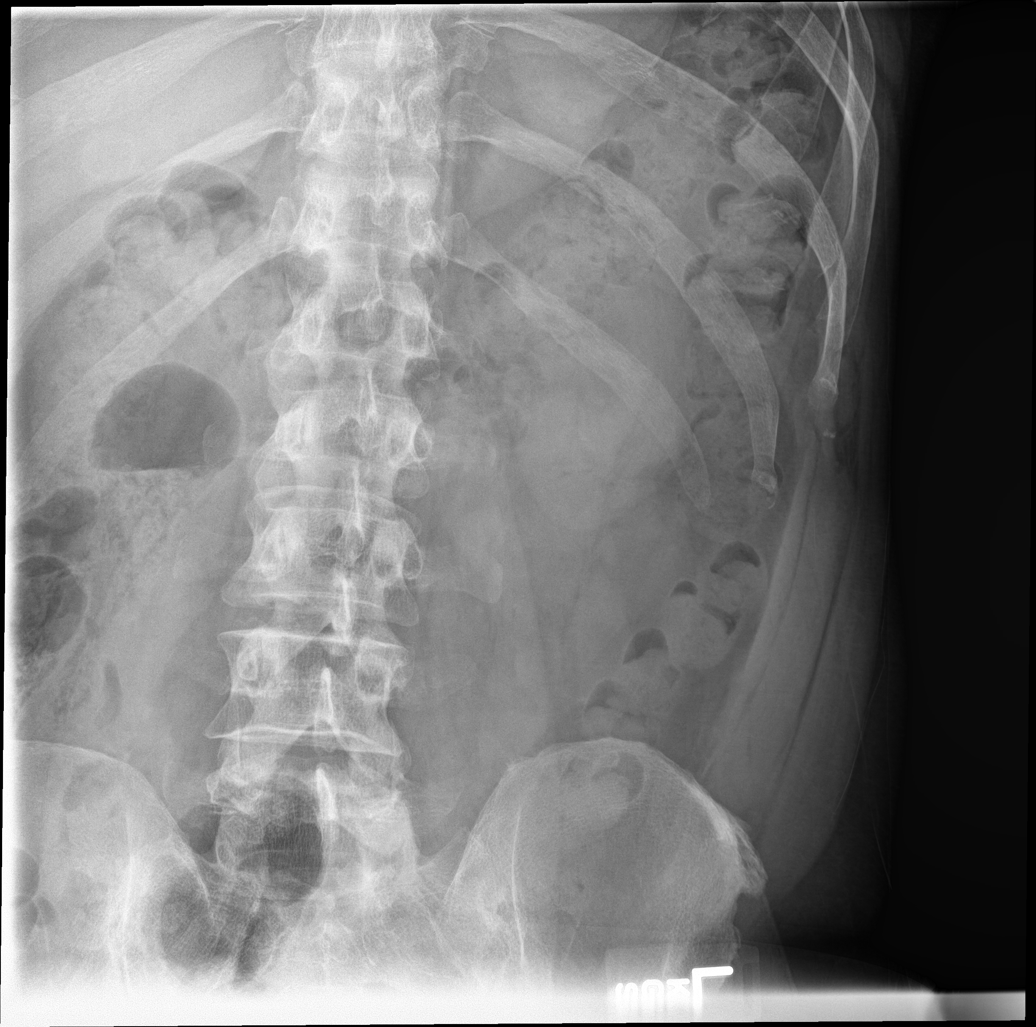

[rib obl (1 of 2)]
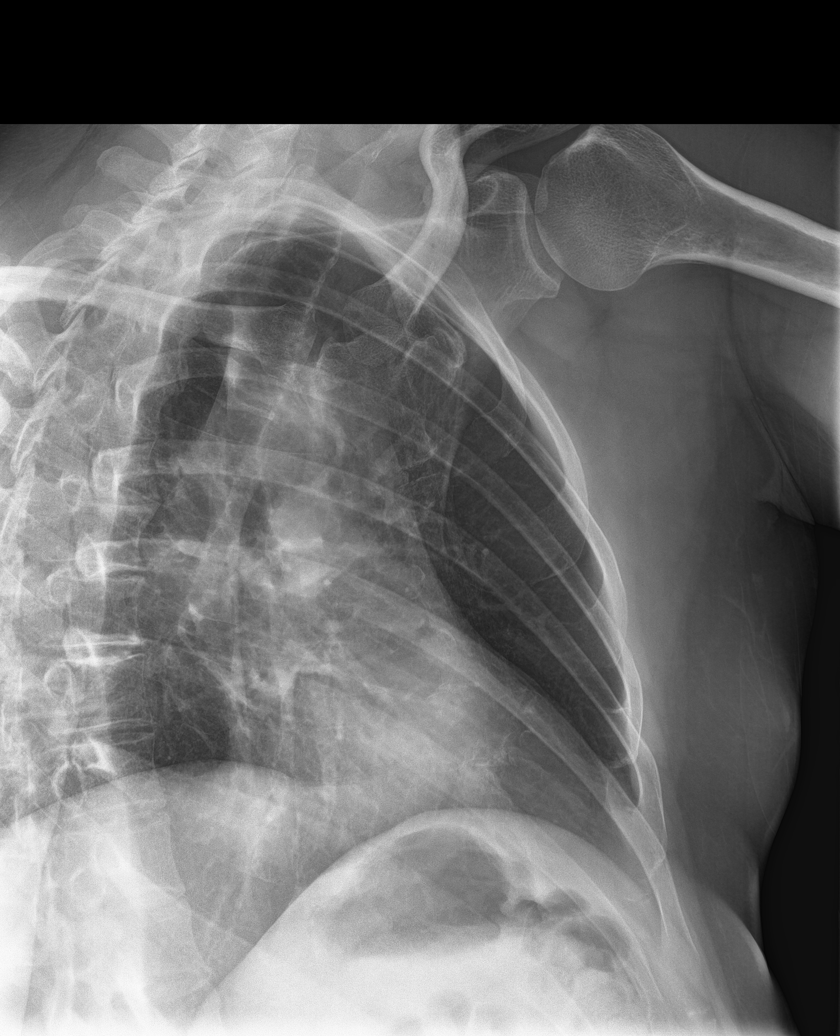

[Series 5: rib obl · 0.14mm/px · 2 of 2 slices shown (2 of 2)]
[im 1/2]
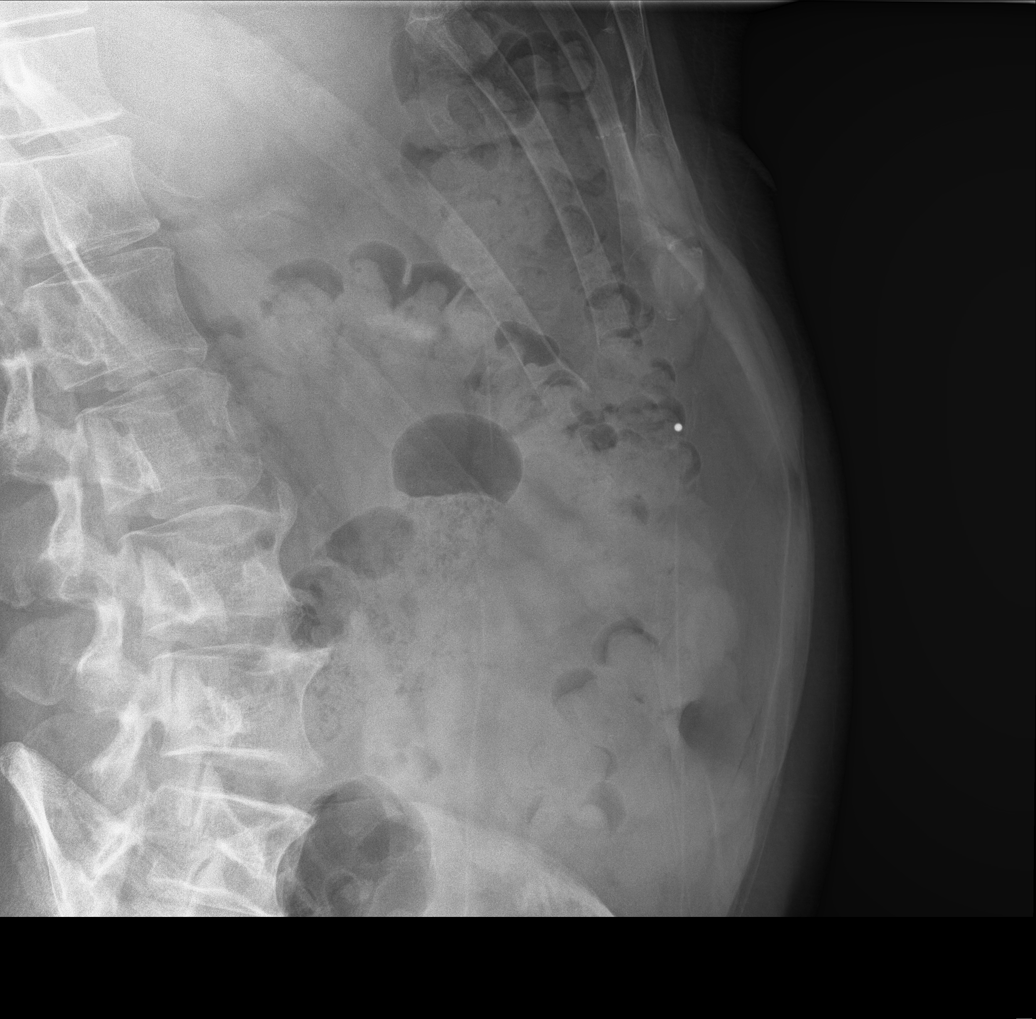
[im 2/2]
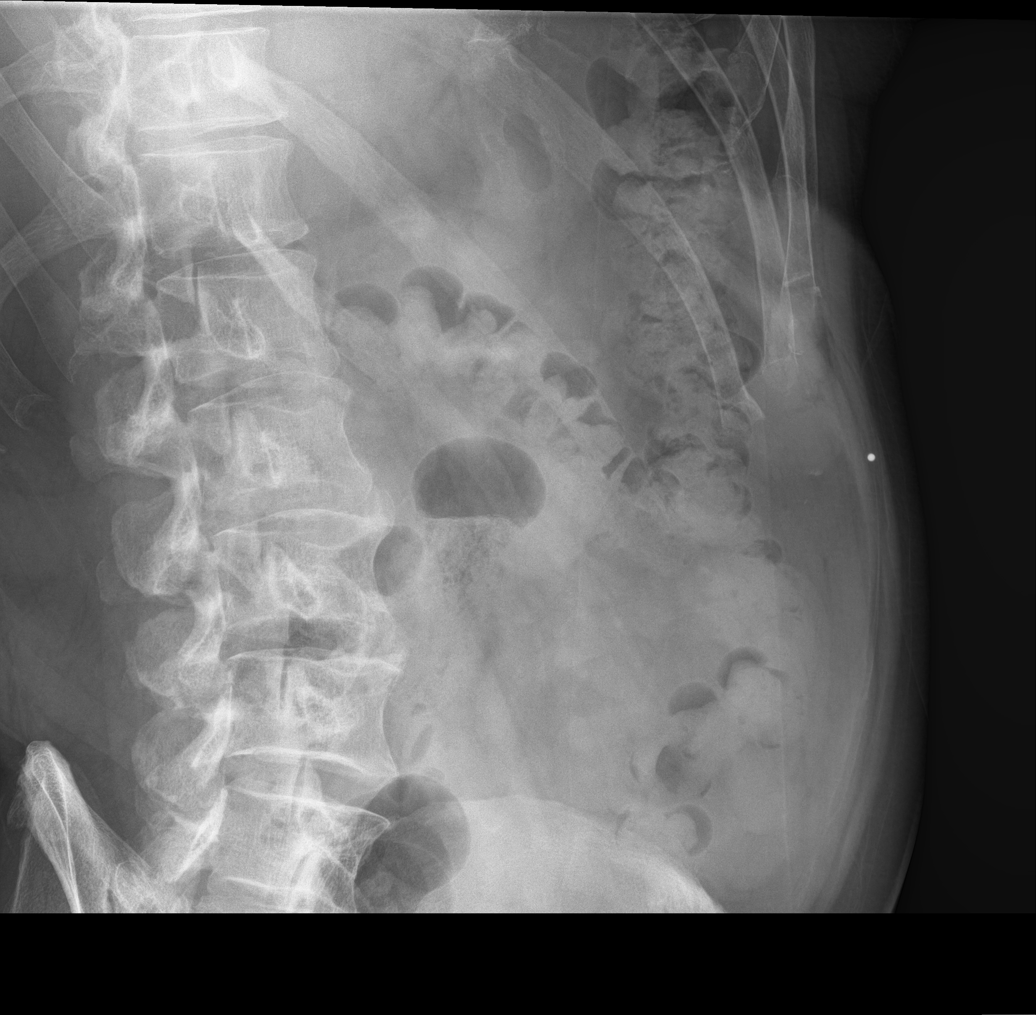

[6 of 6 positions shown; findings below may reference images not displayed]

FINDINGS: Frontal chest as well as oblique and cone-down rib images were
obtained. The lungs are clear. The heart size and pulmonary
vascularity are normal. No adenopathy. No evident pneumothorax or
pleural effusion. No evident rib fracture.
IMPRESSION: No evident rib fracture.  Lungs clear.

## 2022-07-22 ENCOUNTER — Other Ambulatory Visit: Payer: Self-pay | Admitting: Nurse Practitioner

## 2022-07-23 NOTE — Telephone Encounter (Signed)
Requested medication (s) are due for refill today: yes  Requested medication (s) are on the active medication list: yes    Last refill: 03/22/22  #135  0 refills  Future visit scheduled no  Notes to clinic:Not delegated, please review. Thank you.  Requested Prescriptions  Pending Prescriptions Disp Refills   PHENobarbital (LUMINAL) 97.2 MG tablet [Pharmacy Med Name: PHENOBARBITAL 97.2 MG TABLET] 135 tablet 0    Sig: TAKE 1 AND 1/2 TABS AT BEDTIME     Not Delegated - Neurology: Anticonvulsants - Controlled - phenobarbital Failed - 07/22/2022  3:36 PM      Failed - This refill cannot be delegated      Failed - ALT in normal range and within 360 days    ALT  Date Value Ref Range Status  02/03/2021 48 (H) 0 - 44 IU/L Final         Failed - AST in normal range and within 360 days    AST  Date Value Ref Range Status  02/03/2021 43 (H) 0 - 40 IU/L Final         Failed - HCT in normal range and within 360 days    Hematocrit  Date Value Ref Range Status  02/03/2021 46.7 37.5 - 51.0 % Final         Failed - HGB in normal range and within 360 days    Hemoglobin  Date Value Ref Range Status  02/03/2021 16.4 13.0 - 17.7 g/dL Final         Failed - WBC in normal range and within 360 days    WBC  Date Value Ref Range Status  02/03/2021 8.2 3.4 - 10.8 x10E3/uL Final         Failed - PLT in normal range and within 360 days    Platelets  Date Value Ref Range Status  02/03/2021 261 150 - 450 x10E3/uL Final         Passed - Phenobarbital in normal range and within 360 days    No results found for: "LABPHEN", "PHENOBARB"       Passed - Cr in normal range and within 360 days    Creatinine, Ser  Date Value Ref Range Status  09/04/2021 1.03 0.76 - 1.27 mg/dL Final         Passed - Completed PHQ-2 or PHQ-9 in the last 360 days      Passed - Patient is not pregnant      Passed - Valid encounter within last 12 months    Recent Outpatient Visits           10 months ago  Seizure disorder ([place])   [place], Henrine Screws T, NP   1 year ago Erectile dysfunction, unspecified erectile dysfunction type   [place] Jon Billings, NP   1 year ago Seizure disorder [facility])   [place], Barbaraann Faster, NP   1 year ago Seizure disorder [facility])   [place], Henrine Screws T, NP   2 years ago Encounter for annual physical exam   [place], Barbaraann Faster, NP

## 2022-09-13 ENCOUNTER — Ambulatory Visit: Payer: BC Managed Care – PPO | Admitting: Dermatology

## 2022-10-05 ENCOUNTER — Other Ambulatory Visit: Payer: Self-pay | Admitting: Nurse Practitioner

## 2022-10-08 NOTE — Telephone Encounter (Signed)
Pt. Has appointment. Requested Prescriptions  Pending Prescriptions Disp Refills   tadalafil (CIALIS) 20 MG tablet [Pharmacy Med Name: TADALAFIL 20 MG TABLET] 4 tablet 1    Sig: TAKE 1 TABLET BY MOUTH EVERY DAY AS NEEDED     Urology: Erectile Dysfunction Agents Failed - 10/05/2022  5:50 PM      Failed - AST in normal range and within 360 days    AST  Date Value Ref Range Status  02/03/2021 43 (H) 0 - 40 IU/L Final         Failed - ALT in normal range and within 360 days    ALT  Date Value Ref Range Status  02/03/2021 48 (H) 0 - 44 IU/L Final         Failed - Last BP in normal range    BP Readings from Last 1 Encounters:  06/27/22 (!) 160/101         Failed - Valid encounter within last 12 months    Recent Outpatient Visits           1 year ago Seizure disorder [facility])   [place] [facility] [place], Corrie Dandy T, NP   1 year ago Erectile dysfunction, unspecified erectile dysfunction type   [place] [facility] Larae Grooms, NP   1 year ago Seizure disorder [facility])   [place] [facility] [place], Corrie Dandy T, NP   2 years ago Seizure disorder [facility])   [place] [facility] [place], Corrie Dandy T, NP   2 years ago Encounter for annual physical exam   [place] [facility] [place], Dorie Rank, NP       Future Appointments             In 2 weeks [place], Dorie Rank, NP [place] [facility]

## 2022-10-26 ENCOUNTER — Ambulatory Visit: Payer: BC Managed Care – PPO | Admitting: Nurse Practitioner

## 2022-10-26 ENCOUNTER — Encounter: Payer: Self-pay | Admitting: Nurse Practitioner

## 2022-10-26 VITALS — BP 140/88 | HR 67 | Temp 97.9°F | Ht 69.02 in | Wt 210.6 lb

## 2022-10-26 DIAGNOSIS — G40909 Epilepsy, unspecified, not intractable, without status epilepticus: Secondary | ICD-10-CM

## 2022-10-26 DIAGNOSIS — Z5181 Encounter for therapeutic drug level monitoring: Secondary | ICD-10-CM

## 2022-10-26 DIAGNOSIS — Z833 Family history of diabetes mellitus: Secondary | ICD-10-CM

## 2022-10-26 DIAGNOSIS — I10 Essential (primary) hypertension: Secondary | ICD-10-CM

## 2022-10-26 DIAGNOSIS — E7800 Pure hypercholesterolemia, unspecified: Secondary | ICD-10-CM

## 2022-10-26 DIAGNOSIS — N400 Enlarged prostate without lower urinary tract symptoms: Secondary | ICD-10-CM

## 2022-10-26 MED ORDER — LISINOPRIL 10 MG PO TABS
10.0000 mg | ORAL_TABLET | Freq: Every day | ORAL | 3 refills | Status: DC
Start: 2022-10-26 — End: 2022-12-07

## 2022-10-26 NOTE — Assessment & Plan Note (Signed)
Check A1c today per patient request for screening. 

## 2022-10-26 NOTE — Assessment & Plan Note (Signed)
Chronic, ongoing.  BP above goal in office today and at home. Increase Lisinopril to 10 MG daily, educated him on this change.  Recommend he check BP at home at least three mornings a week.  LABS: CBC, CMP, TSH.  Focus on DASH diet at home.  Return in June for BP recheck.

## 2022-10-26 NOTE — Patient Instructions (Signed)
Seizure, Adult A seizure is a sudden burst of abnormal electrical and chemical activity in the brain. Seizures usually last from 30 seconds to 2 minutes.  What are the causes? Common causes of this condition include: Fever or infection. Problems that affect the brain. These may include: A brain or head injury. Bleeding in the brain. A brain tumor. Low levels of blood sugar or salt. Kidney problems or liver problems. Conditions that are passed from parent to child (are inherited). Problems with a substance, such as: Having a reaction to a drug or a medicine. Stopping the use of a substance all of a sudden (withdrawal). A stroke. Disorders that affect how you develop. Sometimes, the cause may not be known.  What increases the risk? Having someone in your family who has epilepsy. In this condition, seizures happen again and again over time. They have no clear cause. Having had a tonic-clonic seizure before. This type of seizure causes you to: Tighten the muscles of the whole body. Lose consciousness. Having had a head injury or strokes before. Having had a lack of oxygen at birth. What are the signs or symptoms? There are many types of seizures. The symptoms vary depending on the type of seizure you have. Symptoms during a seizure Shaking that you cannot control (convulsions) with fast, jerky movements of muscles. Stiffness of the body. Breathing problems. Feeling mixed up (confused). Staring or not responding to sound or touch. Head nodding. Eyes that blink, flutter, or move fast. Drooling, grunting, or making clicking sounds with your mouth Losing control of when you pee or poop. Symptoms before a seizure Feeling afraid, nervous, or worried. Feeling like you may vomit. Feeling like: You are moving when you are not. Things around you are moving when they are not. Feeling like you saw or heard something before (dj vu). Odd tastes or smells. Changes in how you see. You may  see flashing lights or spots. Symptoms after a seizure Feeling confused. Feeling sleepy. Headache. Sore muscles. How is this treated? If your seizure stops on its own, you will not need treatment. If your seizure lasts longer than 5 minutes, you will normally need treatment. Treatment may include: Medicines given through an IV tube. Avoiding things, such as medicines, that are known to cause your seizures. Medicines to prevent seizures. A device to prevent or control seizures. Surgery. A diet low in carbohydrates and high in fat (ketogenic diet). Follow these instructions at home: Medicines Take over-the-counter and prescription medicines only as told by your doctor. Avoid foods or drinks that may keep your medicine from working, such as alcohol. Activity Follow instructions about driving, swimming, or doing things that would be dangerous if you had another seizure. Wait until your doctor says it is safe for you to do these things. If you live in the [place] ask your local department of motor vehicles when you can drive. Get a lot of rest. Teaching others  Teach friends and family what to do when you have a seizure. They should: Help you get down to the ground. Protect your head and body. Loosen any clothing around your neck. Turn you on your side. Know whether or not you need emergency care. Stay with you until you are better. Also, tell them what not to do if you have a seizure. Tell them: They should not hold you down. They should not put anything in your mouth. General instructions Avoid anything that gives you seizures. Keep a seizure diary. Write down: What you remember   about each seizure. What you think caused each seizure. Keep all follow-up visits. Contact a doctor if: You have another seizure or seizures. Call the doctor each time you have a seizure. The pattern of your seizures changes. You keep having seizures with treatment. You have symptoms of being sick or  having an infection. You are not able to take your medicine. Get help right away if: You have any of these problems: A seizure that lasts longer than 5 minutes. Many seizures in a row and you do not feel better between seizures. A seizure that makes it harder to breathe. A seizure and you can no longer speak or use part of your body. You do not wake up right after a seizure. You get hurt during a seizure. You feel confused or have pain right after a seizure. These symptoms may be an emergency. Get help right away. Call your local emergency services (911 in the [place] Do not wait to see if the symptoms will go away. Do not drive yourself to the hospital. Summary A seizure is a sudden burst of abnormal electrical and chemical activity in the brain. Seizures normally last from 30 seconds to 2 minutes. Causes of seizures include illness, injury to the head, low levels of blood sugar or salt, and certain conditions. Most seizures will stop on their own in less than 5 minutes. Seizures that last longer than 5 minutes are a medical emergency and need treatment right away. Many medicines are used to treat seizures. Take over-the-counter and prescription medicines only as told by your doctor. This information is not intended to replace advice given to you by your health care provider. Make sure you discuss any questions you have with your health care provider. Document Revised: 12/18/2019 Document Reviewed: 12/18/2019 Elsevier Patient Education  2023 Elsevier Inc.  

## 2022-10-26 NOTE — Assessment & Plan Note (Addendum)
Chronic, stable with no seizure since 1991.  Continue current medication regimen and check phenobarbital level today -- will continue 1 1/2 tablets as is consistent with taking.  Referral to neuro if any worsening seizures present.  Return in 6 months.

## 2022-10-26 NOTE — Assessment & Plan Note (Signed)
Check Phenobarbital level 

## 2022-10-26 NOTE — Progress Notes (Signed)
BP (!) 140/88 (BP Location: Left Arm, Patient Position: Sitting, Cuff Size: Normal)   Pulse 67   Temp 97.9 F (36.6 C) (Oral)   Ht 5' 9.02" (1.753 m)   Wt 210 lb 9.6 oz (95.5 kg)   SpO2 95%   BMI 31.09 kg/m    Subjective:    Patient ID: Dillon Blake, male    DOB: [birth year], 57 y.o.   MRN: [id number]  HPI: Dillon Blake is a 57 y.o. male  Chief Complaint  Patient presents with   seizure disorder   HYPERTENSION Continues on Lisinopril 5 MG daily.  Family history of diabetes in mother, maternal grandmother, and brother. Hypertension status: controlled  Satisfied with current treatment? yes Duration of hypertension: chronic BP monitoring frequency: once a week BP range: 130-150/90's BP medication side effects:  no Medication compliance: good compliance Aspirin: no Recurrent headaches: no Visual changes: no Palpitations: no Dyspnea: no Chest pain: no Lower extremity edema: no Dizzy/lightheaded: no  The 10-year ASCVD risk score (Arnett DK, et al., 2019) is: 6.4%   Values used to calculate the score:     Age: 57 years     Sex: Male     Is Non-Hispanic African American: No     Diabetic: No     Tobacco smoker: No     Systolic Blood Pressure: 140 mmHg     Is BP treated: Yes     HDL Cholesterol: 70 mg/dL     Total Cholesterol: 240 mg/dL  SEIZURE DISORDER: Continues on 97.2 MG at bedtime, been on this for many years.  He is taking 1 1/2 tablets of Phenobarbital, to help with sleep. Last seizure was in April 1991, first one was June of 1987.  Has only had two seizures in his life.  Did see neurology years ago and they did sleep testing, he reports they found that when he had lack of sleep this led to seizures.  Stays on adequate sleep regimen at home.  PDMP review last fill 07/24/22, no other controlled substances noted.   Relevant past medical, surgical, family and social history reviewed and updated as indicated. Interim medical history since our last visit  reviewed. Allergies and medications reviewed and updated.  Review of Systems  Constitutional:  Negative for activity change, diaphoresis, fatigue and fever.  Respiratory:  Negative for cough, chest tightness, shortness of breath and wheezing.   Cardiovascular:  Negative for chest pain, palpitations and leg swelling.  Gastrointestinal: Negative.  Negative for nausea.  Psychiatric/Behavioral: Negative.      Per HPI unless specifically indicated above     Objective:    BP (!) 140/88 (BP Location: Left Arm, Patient Position: Sitting, Cuff Size: Normal)   Pulse 67   Temp 97.9 F (36.6 C) (Oral)   Ht 5' 9.02" (1.753 m)   Wt 210 lb 9.6 oz (95.5 kg)   SpO2 95%   BMI 31.09 kg/m   Wt Readings from Last 3 Encounters:  10/26/22 210 lb 9.6 oz (95.5 kg)  09/04/21 199 lb (90.3 kg)  06/30/21 203 lb (92.1 kg)    Physical Exam Vitals and nursing note reviewed.  Constitutional:      General: He is awake. He is not in acute distress.    Appearance: He is well-developed and well-groomed. He is not ill-appearing.  HENT:     Head: Normocephalic and atraumatic.     Right Ear: Hearing normal. No drainage.     Left Ear: Hearing normal. No drainage.  Eyes:     General: Lids are normal.        Right eye: No discharge.        Left eye: No discharge.     Conjunctiva/sclera: Conjunctivae normal.     Pupils: Pupils are equal, round, and reactive to light.  Neck:     Thyroid: No thyromegaly.     Vascular: No carotid bruit.     Trachea: Trachea normal.  Cardiovascular:     Rate and Rhythm: Normal rate and regular rhythm.     Heart sounds: Normal heart sounds, S1 normal and S2 normal. No murmur heard.    No gallop.  Pulmonary:     Effort: Pulmonary effort is normal. No accessory muscle usage or respiratory distress.     Breath sounds: Normal breath sounds.  Abdominal:     General: Bowel sounds are normal.     Palpations: Abdomen is soft.  Musculoskeletal:        General: Normal range of  motion.     Cervical back: Normal range of motion and neck supple. No erythema. No pain with movement, spinous process tenderness or muscular tenderness. Normal range of motion.     Right lower leg: No edema.     Left lower leg: No edema.  Skin:    General: Skin is warm and dry.     Capillary Refill: Capillary refill takes less than 2 seconds.     Findings: No rash.  Neurological:     Mental Status: He is alert and oriented to person, place, and time.     Deep Tendon Reflexes: Reflexes are normal and symmetric.     Reflex Scores:      Brachioradialis reflexes are 2+ on the right side and 2+ on the left side.      Patellar reflexes are 2+ on the right side and 2+ on the left side. Psychiatric:        Attention and Perception: Attention normal.        Mood and Affect: Mood normal.        Speech: Speech normal.        Behavior: Behavior normal. Behavior is cooperative.        Thought Content: Thought content normal.    Results for orders placed or performed in visit on 09/04/21  Phenobarbital level  Result Value Ref Range   Phenobarbital, Serum 16 15 - 40 ug/mL  Basic metabolic panel  Result Value Ref Range   Glucose 99 70 - 99 mg/dL   BUN 19 6 - 24 mg/dL   Creatinine, Ser 2.95 0.76 - 1.27 mg/dL   eGFR 87 >62 ZH/YQM/5.78   BUN/Creatinine Ratio 18 9 - 20   Sodium 139 134 - 144 mmol/L   Potassium 4.1 3.5 - 5.2 mmol/L   Chloride 99 96 - 106 mmol/L   CO2 25 20 - 29 mmol/L   Calcium 9.8 8.7 - 10.2 mg/dL  Microalbumin, Urine Waived  Result Value Ref Range   Microalb, Ur Waived 30 (H) 0 - 19 mg/L   Creatinine, Urine Waived 300 10 - 300 mg/dL   Microalb/Creat Ratio <30 <30 mg/g  Lipid Panel w/o Chol/HDL Ratio  Result Value Ref Range   Cholesterol, Total 240 (H) 100 - 199 mg/dL   Triglycerides 469 0 - 149 mg/dL   HDL 70 >62 mg/dL   VLDL Cholesterol Cal 19 5 - 40 mg/dL   LDL Chol Calc (NIH) 952 (H) 0 - 99 mg/dL  Assessment & Plan:   Problem List Items Addressed This Visit        Cardiovascular and Mediastinum   Hypertension    Chronic, ongoing.  BP above goal in office today and at home. Increase Lisinopril to 10 MG daily, educated him on this change.  Recommend he check BP at home at least three mornings a week.  LABS: CBC, CMP, TSH.  Focus on DASH diet at home.  Return in June for BP recheck.      Relevant Medications   lisinopril (ZESTRIL) 10 MG tablet   Other Relevant Orders   Comprehensive metabolic panel   CBC with Differential/Platelet   TSH     Nervous and Auditory   Seizure disorder ([facility]) - Primary    Chronic, stable with no seizure since 1991.  Continue current medication regimen and check phenobarbital level today -- will continue 1 1/2 tablets as is consistent with taking.  Referral to neuro if any worsening seizures present.  Return in 6 months.      Relevant Orders   Phenobarbital level     Other   Elevated LDL cholesterol level    Continue diet focus at this time, ASCVD 6.4%, recheck lipid panel today == he is not fasting.      Relevant Orders   Comprehensive metabolic panel   Lipid Panel w/o Chol/HDL Ratio   Family history of diabetes mellitus in mother    Check A1c today per patient request for screening.      Relevant Orders   HgB A1c   Medication monitoring encounter    Check Phenobarbital level      Relevant Orders   Phenobarbital level   Other Visit Diagnoses     Benign prostatic hyperplasia without lower urinary tract symptoms       PSA check on labs today.   Relevant Orders   PSA        Follow up plan: Return in about 6 weeks (around 12/07/2022) for HTN -- increased Lisinopril to 10 MG.

## 2022-10-26 NOTE — Assessment & Plan Note (Signed)
Continue diet focus at this time, ASCVD 6.4%, recheck lipid panel today == he is not fasting.

## 2022-10-27 LAB — COMPREHENSIVE METABOLIC PANEL
ALT: 35 IU/L (ref 0–44)
AST: 29 IU/L (ref 0–40)
Albumin/Globulin Ratio: 2 (ref 1.2–2.2)
Albumin: 4.5 g/dL (ref 3.8–4.9)
Alkaline Phosphatase: 67 IU/L (ref 44–121)
BUN/Creatinine Ratio: 18 (ref 9–20)
BUN: 17 mg/dL (ref 6–24)
Bilirubin Total: <0.2 mg/dL (ref 0.0–1.2)
CO2: 22 mmol/L (ref 20–29)
Calcium: 9.1 mg/dL (ref 8.7–10.2)
Chloride: 101 mmol/L (ref 96–106)
Creatinine, Ser: 0.96 mg/dL (ref 0.76–1.27)
Globulin, Total: 2.3 g/dL (ref 1.5–4.5)
Glucose: 89 mg/dL (ref 70–99)
Potassium: 4.2 mmol/L (ref 3.5–5.2)
Sodium: 138 mmol/L (ref 134–144)
Total Protein: 6.8 g/dL (ref 6.0–8.5)
eGFR: 94 mL/min/{1.73_m2} (ref >59)

## 2022-10-27 LAB — TSH: TSH: 1.62 u[IU]/mL (ref 0.450–4.500)

## 2022-10-27 LAB — CBC WITH DIFFERENTIAL/PLATELET
Basophils Absolute: 0.1 10*3/uL (ref 0.0–0.2)
Basos: 1 %
EOS (ABSOLUTE): 0.2 10*3/uL (ref 0.0–0.4)
Eos: 2 %
Hematocrit: 46.3 % (ref 37.5–51.0)
Hemoglobin: 16.3 g/dL (ref 13.0–17.7)
Immature Grans (Abs): 0 10*3/uL (ref 0.0–0.1)
Immature Granulocytes: 0 %
Lymphocytes Absolute: 2.3 10*3/uL (ref 0.7–3.1)
Lymphs: 29 %
MCH: 32 pg (ref 26.6–33.0)
MCHC: 35.2 g/dL (ref 31.5–35.7)
MCV: 91 fL (ref 79–97)
Monocytes Absolute: 0.8 10*3/uL (ref 0.1–0.9)
Monocytes: 10 %
Neutrophils Absolute: 4.7 10*3/uL (ref 1.4–7.0)
Neutrophils: 58 %
Platelets: 241 10*3/uL (ref 150–450)
RBC: 5.09 x10E6/uL (ref 4.14–5.80)
RDW: 12.8 % (ref 11.6–15.4)
WBC: 8 10*3/uL (ref 3.4–10.8)

## 2022-10-27 LAB — LIPID PANEL W/O CHOL/HDL RATIO
Cholesterol, Total: 201 mg/dL — ABNORMAL HIGH (ref 100–199)
HDL: 45 mg/dL (ref >39)
LDL Chol Calc (NIH): 124 mg/dL — ABNORMAL HIGH (ref 0–99)
Triglycerides: 181 mg/dL — ABNORMAL HIGH (ref 0–149)
VLDL Cholesterol Cal: 32 mg/dL (ref 5–40)

## 2022-10-27 LAB — PSA: Prostate Specific Ag, Serum: 1.1 ng/mL (ref 0.0–4.0)

## 2022-10-27 LAB — PHENOBARBITAL LEVEL: Phenobarbital, Serum: 10 ug/mL — ABNORMAL LOW (ref 15–40)

## 2022-10-27 LAB — HEMOGLOBIN A1C
Est. average glucose Bld gHb Est-mCnc: 111 mg/dL
Hgb A1c MFr Bld: 5.5 % (ref 4.8–5.6)

## 2022-10-27 NOTE — Progress Notes (Signed)
Contacted via MyChart The 10-year ASCVD risk score (Arnett DK, et al., 2019) is: 7.7%   Values used to calculate the score:     Age: 57 years     Sex: Male     Is Non-Hispanic African American: No     Diabetic: No     Tobacco smoker: No     Systolic Blood Pressure: 140 mmHg     Is BP treated: Yes     HDL Cholesterol: 45 mg/dL     Total Cholesterol: 201 mg/dL   Good evening Dillon Blake, your labs have returned: - Phenobarbital level on low side, which is good -- do not want to see too high.  This means we have room to adjust if we ever need to. - Kidney function, creatinine and eGFR, remains normal, as is liver function, AST and ALT.  - CBC shows no anemia or infection. - Thyroid and prostate labs normal. - A1c shows no diabetes or prediabetes. - Cholesterol levels have trended down a little this check, but still elevated.  I would recommend we start a low dose of statin which I can discuss with you more next visit.  Any questions? Keep being amazing!!  Thank you for allowing me to participate in your care.  I appreciate you. Kindest regards, [name]

## 2022-11-15 ENCOUNTER — Ambulatory Visit: Payer: BC Managed Care – PPO | Admitting: Dermatology

## 2022-12-02 NOTE — Patient Instructions (Signed)

## 2022-12-07 ENCOUNTER — Encounter: Payer: Self-pay | Admitting: Nurse Practitioner

## 2022-12-07 ENCOUNTER — Ambulatory Visit: Payer: BC Managed Care – PPO | Admitting: Nurse Practitioner

## 2022-12-07 ENCOUNTER — Other Ambulatory Visit: Payer: Self-pay | Admitting: Nurse Practitioner

## 2022-12-07 VITALS — BP 123/85 | HR 68 | Temp 97.6°F | Ht 69.02 in | Wt 210.2 lb

## 2022-12-07 DIAGNOSIS — M79604 Pain in right leg: Secondary | ICD-10-CM | POA: Insufficient documentation

## 2022-12-07 DIAGNOSIS — M545 Low back pain, unspecified: Secondary | ICD-10-CM | POA: Diagnosis not present

## 2022-12-07 DIAGNOSIS — I10 Essential (primary) hypertension: Secondary | ICD-10-CM

## 2022-12-07 MED ORDER — METHOCARBAMOL 500 MG PO TABS: 500.0000 mg | ORAL_TABLET | Freq: Three times a day (TID) | ORAL | 0 refills | Status: DC | PRN

## 2022-12-07 MED ORDER — LISINOPRIL 10 MG PO TABS: 10.0000 mg | ORAL_TABLET | Freq: Every day | ORAL | 4 refills | Status: DC

## 2022-12-07 MED ORDER — METHYLPREDNISOLONE 4 MG PO TBPK: ORAL_TABLET | ORAL | 0 refills | Status: DC
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
## 2022-12-07 NOTE — Assessment & Plan Note (Signed)
Ongoing for 5 years and flares when over exerts.  Appears to present to quadriceps area -- ?related to hip, radiation from hamstring area, or IT band.  Notices it with cycling or if over exerts.  Tried PT in past and this caused worsening discomfort, yoga also caused worsening discomfort.  Recommend visit with chiropractor and look into acupuncture to area.  Trial Voltaren gel as needed.  Will send in steroid taper and Robaxin to see if benefit.  Discussed trying pool exercises.

## 2022-12-07 NOTE — Assessment & Plan Note (Signed)
Acute, starting 4-5 weeks ago.  Appears more muscular in nature and possibly related to chronic right leg issues.  Will send in steroid taper and Robaxin to use as needed.  Recommend he visit with chiropractor, who also performs acupuncture, and look into adjustment.  Voltaren gel as needed.

## 2022-12-07 NOTE — Telephone Encounter (Signed)
Requested Prescriptions  Pending Prescriptions Disp Refills   tadalafil (CIALIS) 20 MG tablet [Pharmacy Med Name: TADALAFIL 20 MG TABLET] 4 tablet 1    Sig: TAKE 1 TABLET BY MOUTH EVERY DAY AS NEEDED     Urology: Erectile Dysfunction Agents Passed - 12/07/2022  3:19 PM      Passed - AST in normal range and within 360 days    AST  Date Value Ref Range Status  10/26/2022 29 0 - 40 IU/L Final         Passed - ALT in normal range and within 360 days    ALT  Date Value Ref Range Status  10/26/2022 35 0 - 44 IU/L Final         Passed - Last BP in normal range    BP Readings from Last 1 Encounters:  12/07/22 123/85         Passed - Valid encounter within last 12 months    Recent Outpatient Visits           Today Primary hypertension   [place] [facility] [place], [place] T, NP   1 month ago Seizure disorder [facility])   [place] [facility] [place], Corrie Dandy T, NP   1 year ago Seizure disorder [facility])   [place] [facility] [place], Corrie Dandy T, NP   1 year ago Erectile dysfunction, unspecified erectile dysfunction type   [place] [facility] Larae Grooms, NP   1 year ago Seizure disorder [facility])    [facility] Marjie Skiff, NP       Future Appointments             In 1 month [place], Dorie Rank, NP  [facility]   In 2 months Elie Goody, MD [facility]

## 2022-12-07 NOTE — Assessment & Plan Note (Signed)
Chronic, stable.  BP trending down with increase in Lisinopril. Continue Lisinopril 10 MG daily, educated him on this change.  Recommend he check BP at home at least three mornings a week.  LABS: BMP.  Focus on DASH diet at home.  Will send in refills.

## 2022-12-08 LAB — BASIC METABOLIC PANEL
BUN/Creatinine Ratio: 12 (ref 9–20)
BUN: 14 mg/dL (ref 6–24)
CO2: 24 mmol/L (ref 20–29)
Calcium: 9.6 mg/dL (ref 8.7–10.2)
Chloride: 99 mmol/L (ref 96–106)
Creatinine, Ser: 1.21 mg/dL (ref 0.76–1.27)
Glucose: 78 mg/dL (ref 70–99)
Potassium: 4.6 mmol/L (ref 3.5–5.2)
Sodium: 138 mmol/L (ref 134–144)
eGFR: 71 mL/min/{1.73_m2} (ref >59)

## 2022-12-09 NOTE — Progress Notes (Signed)
Contacted via MyChart   Good morning Viviana, your labs have returned and electrolytes + kidney function are normal.  Great news!!  Continue current medication.  Let me know if steroids help you pain and how the chiropractor goes.  As mentioned if pain continues we may want to consider a referral to sports medicine provider to further assess and offer recommendations for this chronic issue. Any questions? Keep being awesome!!  Thank you for allowing me to participate in your care.  I appreciate you. Kindest regards, Reiana Poteet

## 2022-12-24 DIAGNOSIS — M5451 Vertebrogenic low back pain: Secondary | ICD-10-CM | POA: Diagnosis not present

## 2022-12-24 DIAGNOSIS — M9902 Segmental and somatic dysfunction of thoracic region: Secondary | ICD-10-CM | POA: Diagnosis not present

## 2022-12-24 DIAGNOSIS — M9905 Segmental and somatic dysfunction of pelvic region: Secondary | ICD-10-CM | POA: Diagnosis not present

## 2022-12-24 DIAGNOSIS — M9903 Segmental and somatic dysfunction of lumbar region: Secondary | ICD-10-CM | POA: Diagnosis not present

## 2022-12-26 DIAGNOSIS — M9903 Segmental and somatic dysfunction of lumbar region: Secondary | ICD-10-CM | POA: Diagnosis not present

## 2022-12-26 DIAGNOSIS — M9902 Segmental and somatic dysfunction of thoracic region: Secondary | ICD-10-CM | POA: Diagnosis not present

## 2022-12-26 DIAGNOSIS — M9906 Segmental and somatic dysfunction of lower extremity: Secondary | ICD-10-CM | POA: Diagnosis not present

## 2022-12-26 DIAGNOSIS — M5451 Vertebrogenic low back pain: Secondary | ICD-10-CM | POA: Diagnosis not present

## 2022-12-26 DIAGNOSIS — M9905 Segmental and somatic dysfunction of pelvic region: Secondary | ICD-10-CM | POA: Diagnosis not present

## 2023-01-01 DIAGNOSIS — M9905 Segmental and somatic dysfunction of pelvic region: Secondary | ICD-10-CM | POA: Diagnosis not present

## 2023-01-01 DIAGNOSIS — M5451 Vertebrogenic low back pain: Secondary | ICD-10-CM | POA: Diagnosis not present

## 2023-01-01 DIAGNOSIS — M9902 Segmental and somatic dysfunction of thoracic region: Secondary | ICD-10-CM | POA: Diagnosis not present

## 2023-01-01 DIAGNOSIS — M9906 Segmental and somatic dysfunction of lower extremity: Secondary | ICD-10-CM | POA: Diagnosis not present

## 2023-01-01 DIAGNOSIS — M9903 Segmental and somatic dysfunction of lumbar region: Secondary | ICD-10-CM | POA: Diagnosis not present

## 2023-01-18 ENCOUNTER — Ambulatory Visit: Payer: BC Managed Care – PPO | Admitting: Nurse Practitioner

## 2023-01-24 ENCOUNTER — Other Ambulatory Visit: Payer: Self-pay | Admitting: Nurse Practitioner

## 2023-01-25 NOTE — Telephone Encounter (Signed)
Requested Prescriptions  Pending Prescriptions Disp Refills   tadalafil (CIALIS) 20 MG tablet [Pharmacy Med Name: TADALAFIL 20 MG TABLET] 4 tablet 1    Sig: TAKE 1 TABLET BY MOUTH EVERY DAY AS NEEDED     Urology: Erectile Dysfunction Agents Passed - 01/24/2023  5:19 PM      Passed - AST in normal range and within 360 days    AST  Date Value Ref Range Status  10/26/2022 29 0 - 40 IU/L Final         Passed - ALT in normal range and within 360 days    ALT  Date Value Ref Range Status  10/26/2022 35 0 - 44 IU/L Final         Passed - Last BP in normal range    BP Readings from Last 1 Encounters:  12/07/22 123/85         Passed - Valid encounter within last 12 months    Recent Outpatient Visits           1 month ago Primary hypertension   [place] [facility] [place], [place] T, NP   3 months ago Seizure disorder [facility])   [place] [facility] [place], Corrie Dandy T, NP   1 year ago Seizure disorder [facility])   [place] [facility] [place], Corrie Dandy T, NP   1 year ago Erectile dysfunction, unspecified erectile dysfunction type   [place] [facility] Larae Grooms, NP   1 year ago Seizure disorder [facility])   [place] [facility] Marjie Skiff, NP       Future Appointments             In 1 week Elie Goody, MD [facility] [place] [facility]   In 3 weeks [place], Dorie Rank, NP  [facility], [facility]

## 2023-02-07 ENCOUNTER — Ambulatory Visit: Payer: BC Managed Care – PPO | Admitting: Dermatology

## 2023-02-10 NOTE — Patient Instructions (Signed)
Hip Pain The hip is the joint between the upper legs and the lower pelvis. The bones, cartilage, tendons, and muscles of your hip joint support your body and allow you to move around. Hip pain can range from a minor ache to severe pain in one or both of your hips. The pain may be felt on the inside of the hip joint near the groin, or on the outside near the buttocks and upper thigh. You may also have swelling or stiffness in your hip area. Follow these instructions at home: Managing pain, stiffness, and swelling     If told, put ice on the painful area. Put ice in a plastic bag. Place a towel between your skin and the bag. Leave the ice on for 20 minutes, 2-3 times a day. If told, apply heat to the affected area as often as told by your health care provider. Use the heat source that your provider recommends, such as a moist heat pack or a heating pad. Place a towel between your skin and the heat source. Leave the heat on for 20-30 minutes. If your skin turns bright red, remove the ice or heat right away to prevent skin damage. The risk of damage is higher if you cannot feel pain, heat, or cold. Activity Do exercises as told by your provider. Avoid activities that cause pain. General instructions  Take over-the-counter and prescription medicines only as told by your provider. Keep a journal of your symptoms. Write down: How often you have hip pain. The location of your pain. What the pain feels like. What makes the pain worse. Sleep with a pillow between your legs on your most comfortable side. Keep all follow-up visits. Your provider will monitor your pain and activity. Contact a health care provider if: You cannot put weight on your leg. Your pain or swelling gets worse after a week. It gets harder to walk. You have a fever. Get help right away if: You fall. You have a sudden increase in pain and swelling in your hip. Your hip is red or swollen or very tender to touch. This  information is not intended to replace advice given to you by your health care provider. Make sure you discuss any questions you have with your health care provider. Document Revised: 02/13/2022 Document Reviewed: 02/13/2022 Elsevier Patient Education  2024 Elsevier Inc.  

## 2023-02-15 ENCOUNTER — Encounter: Payer: Self-pay | Admitting: Nurse Practitioner

## 2023-02-15 ENCOUNTER — Ambulatory Visit: Payer: BC Managed Care – PPO | Admitting: Nurse Practitioner

## 2023-02-15 VITALS — BP 128/82 | HR 76 | Ht 69.0 in | Wt 211.0 lb

## 2023-02-15 DIAGNOSIS — M545 Low back pain, unspecified: Secondary | ICD-10-CM

## 2023-02-15 DIAGNOSIS — M79604 Pain in right leg: Secondary | ICD-10-CM

## 2023-02-15 NOTE — Assessment & Plan Note (Signed)
Ongoing for >5 years and flares when over exerts.  Appears to present to quadriceps area.  Notices it with cycling or if over exerts.  Tried PT in past and this caused worsening discomfort, yoga also caused worsening discomfort.  Recommend continue with chiropractor care and Robaxin as needed.  Trial Voltaren gel as needed.
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
## 2023-02-15 NOTE — Assessment & Plan Note (Signed)
Acute and slowly improving with chiropractor care.  Appears more muscular in nature and possibly related to chronic right leg issues.  Continue chiropractor visits and Robaxin as needed.  Voltaren gel as needed.  Return to office if worsening.

## 2023-03-28 ENCOUNTER — Other Ambulatory Visit: Payer: Self-pay | Admitting: Nurse Practitioner

## 2023-03-29 NOTE — Telephone Encounter (Signed)
Requested Prescriptions  Pending Prescriptions Disp Refills   tadalafil (CIALIS) 20 MG tablet [Pharmacy Med Name: TADALAFIL 20 MG TABLET] 90 tablet 0    Sig: TAKE 1 TABLET BY MOUTH EVERY DAY AS NEEDED     Urology: Erectile Dysfunction Agents Passed - 03/28/2023  6:11 PM      Passed - AST in normal range and within 360 days    AST  Date Value Ref Range Status  10/26/2022 29 0 - 40 IU/L Final         Passed - ALT in normal range and within 360 days    ALT  Date Value Ref Range Status  10/26/2022 35 0 - 44 IU/L Final         Passed - Last BP in normal range    BP Readings from Last 1 Encounters:  02/15/23 128/82         Passed - Valid encounter within last 12 months    Recent Outpatient Visits           1 month ago Acute bilateral low back pain without sciatica   [place] [facility] [place], Corrie Dandy T, NP   3 months ago Primary hypertension   [place] [facility] [place], [place] T, NP   5 months ago Seizure disorder [facility])   [place] [facility] [place], Corrie Dandy T, NP   1 year ago Seizure disorder [facility])   [place] [facility] [place], Corrie Dandy T, NP   1 year ago Erectile dysfunction, unspecified erectile dysfunction type   [place] [facility] Larae Grooms, NP       Future Appointments             In 2 weeks [facility] Goody, [facility]   In 2 months [place], Dorie Rank, NP [place] [facility]

## 2023-04-17 ENCOUNTER — Ambulatory Visit: Payer: BC Managed Care – PPO | Admitting: Dermatology

## 2023-04-25 ENCOUNTER — Ambulatory Visit: Payer: BC Managed Care – PPO | Admitting: Pediatrics

## 2023-04-25 ENCOUNTER — Encounter: Payer: Self-pay | Admitting: Pediatrics

## 2023-04-25 VITALS — BP 129/78 | HR 65 | Temp 97.9°F | Wt 202.4 lb

## 2023-04-25 DIAGNOSIS — H40153 Residual stage of open-angle glaucoma, bilateral: Secondary | ICD-10-CM | POA: Diagnosis not present

## 2023-04-25 DIAGNOSIS — R519 Headache, unspecified: Secondary | ICD-10-CM

## 2023-04-25 NOTE — Patient Instructions (Addendum)
We will get MRI first, I'll send you a message once I have the result

## 2023-04-25 NOTE — Progress Notes (Unsigned)
Office Visit  BP 129/78   Pulse 65   Temp 97.9 F (36.6 C) (Oral)   Wt 202 lb 6.4 oz (91.8 kg)   SpO2 99%   BMI 29.89 kg/m    Subjective:    Patient ID: Dillon Blake, male    DOB: 11-Jan-1966, 57 y.o.   MRN: [id number]  HPI: Dillon Blake is a 57 y.o. male 47 Chief Complaint  Patient presents with   Headache    Pt states started about 2 weeks ago    Discussed the use of AI scribe software for clinical note transcription with the patient, who gave verbal consent to proceed.  History of Present Illness   The patient, with a history of seizures controlled by phenobarbital, presented with a two-week history of headaches. Initially, the headaches were located behind the right eye, often occurring after driving. The following week, the patient experienced temporal headaches, sensitive to touch, and most noticeable upon waking. These headaches persisted for three days, then subsided for three days after the patient took Cialis. The headaches returned, but were less severe and eventually almost absent. However, the patient reported a recurrence of the headache behind the eye after driving to work. The headache subsided by midday, but returned during the drive home.  The patient also reported a sensation of being slightly off-balance, similar to a previous experience with vertigo due to an inner ear infection. This sensation was described as a mild wobbliness, particularly noticeable when making a right turn or driving at high speeds. The patient denied any recent ear pain or infection.  The patient's symptoms were not consistently relieved by Excedrin or Tylenol. The patient had recently undergone an eye examination, including a glaucoma pressure check, with no significant findings. The patient also reported a history of seizures, last occurring in 1991, and controlled with phenobarbital since then. No imaging was performed at the time of the seizure diagnosis.      Relevant past  medical, surgical, family and social history reviewed and updated as indicated. Interim medical history since our last visit reviewed. Allergies and medications reviewed and updated.  ROS per HPI unless specifically indicated above     Objective:    BP 129/78   Pulse 65   Temp 97.9 F (36.6 C) (Oral)   Wt 202 lb 6.4 oz (91.8 kg)   SpO2 99%   BMI 29.89 kg/m   Wt Readings from Last 3 Encounters:  04/25/23 202 lb 6.4 oz (91.8 kg)  02/15/23 211 lb (95.7 kg)  12/07/22 210 lb 3.2 oz (95.3 kg)     Physical Exam Constitutional:      Appearance: Normal appearance.  HENT:     Head: Normocephalic and atraumatic.  Eyes:     Pupils: Pupils are equal, round, and reactive to light.  Cardiovascular:     Rate and Rhythm: Normal rate and regular rhythm.     Pulses: Normal pulses.     Heart sounds: Normal heart sounds.  Pulmonary:     Effort: Pulmonary effort is normal.     Breath sounds: Normal breath sounds.  Abdominal:     General: Abdomen is flat.     Palpations: Abdomen is soft.  Musculoskeletal:        General: Normal range of motion.     Cervical back: Normal range of motion.  Skin:    General: Skin is warm and dry.     Capillary Refill: Capillary refill takes less than 2 seconds.  Neurological:  General: No focal deficit present.     Mental Status: He is alert and oriented to person, place, and time. Mental status is at baseline.     Cranial Nerves: Cranial nerves 2-12 are intact.     Sensory: Sensation is intact.     Motor: Motor function is intact.     Coordination: Coordination is intact.     Gait: Gait is intact.     Deep Tendon Reflexes: Reflexes are normal and symmetric.  Psychiatric:        Mood and Affect: Mood normal.        Behavior: Behavior normal.         04/25/2023    4:23 PM 02/15/2023    4:01 PM 10/26/2022    3:16 PM 09/04/2021    2:33 PM 02/03/2021    2:50 PM  Depression screen PHQ 2/9  Decreased Interest 0 0 0  0  Down, Depressed, Hopeless 0 0  0  0  PHQ - 2 Score 0 0 0  0  Altered sleeping 0 1 1 3    Tired, decreased energy 0 0 0 0   Change in appetite 0 0 0 0   Feeling bad or failure about yourself  0 0 0 0   Trouble concentrating 0 0 0 0   Moving slowly or fidgety/restless 0 0 0 0   Suicidal thoughts 0 0 0 0   PHQ-9 Score 0 1 1    Difficult doing work/chores Not difficult at all Not difficult at all Not difficult at all         04/25/2023    4:23 PM 02/15/2023    4:02 PM 10/26/2022    3:17 PM 09/04/2021    2:33 PM  GAD 7 : Generalized Anxiety Score  Nervous, Anxious, on Edge 0 0 0 0  Control/stop worrying 0 0 0 0  Worry too much - different things 0 0 0 0  Trouble relaxing 0 0 1 0  Restless 0 0 0 0  Easily annoyed or irritable 0 0 1 1  Afraid - awful might happen 0 0 0 0  Total GAD 7 Score 0 0 2 1  Anxiety Difficulty Not difficult at all Not difficult at all Not difficult at all Not difficult at all       Assessment & Plan:  Assessment & Plan   New onset of headaches after age 22 He presents with a new onset headache in the right temporal and retro-orbital region for the past two weeks, described as intermittent and lasting a couple of hours, relieved by Excedrin. No visual changes have been reported, and a recent eye exam, including a glaucoma pressure check, was normal. Unclear etiology otherwise, consider vestibular cause to dizziness at follow up. Low suspicion for GCA based on exam today but can consider if ongoing pain and/or normal imaging. Additionally has h/o seizure disorder and is on phenobarbital. We will order an MRI of the head to rule out serious causes such as a tumor or vascular abnormalities, considering his age and the new onset of symptoms.  -     MR BRAIN W WO CONTRAST; Future  Follow up plan: Return if symptoms worsen or fail to improve.  Ezana Hubbert Howell Pringle, MD  Addend: MRI normal 05/01/23. Added CRP, ESR to r/o GCA.

## 2023-04-29 ENCOUNTER — Other Ambulatory Visit: Payer: Self-pay | Admitting: Nurse Practitioner

## 2023-04-30 ENCOUNTER — Inpatient Hospital Stay
Admission: RE | Admit: 2023-04-30 | Discharge: 2023-04-30 | Discharge status: Home / Self Care | Payer: BC Managed Care – PPO | Source: Ambulatory Visit | Attending: Pediatrics | Admitting: Pediatrics

## 2023-04-30 DIAGNOSIS — R519 Headache, unspecified: Secondary | ICD-10-CM

## 2023-04-30 MED ORDER — GADOPICLENOL 0.5 MMOL/ML IV SOLN
10.0000 mL | Freq: Once | INTRAVENOUS | Status: AC | PRN
  Administered 2023-04-30: 9 mL via INTRAVENOUS

## 2023-04-30 NOTE — Telephone Encounter (Signed)
Requested medications are due for refill today.  yes  Requested medications are on the active medications list.  yes  Last refill. 07/23/2022 #135 1 rf  Future visit scheduled.   yes  Notes to clinic.  Refill not delegated.    Requested Prescriptions  Pending Prescriptions Disp Refills   PHENobarbital (LUMINAL) 97.2 MG tablet [Pharmacy Med Name: PHENOBARBITAL 97.2 MG TABLET] 135 tablet     Sig: TAKE 1 AND 1/2 TABS AT BEDTIME     Not Delegated - Neurology: Anticonvulsants - Controlled - phenobarbital Failed - 04/29/2023  5:54 PM      Failed - This refill cannot be delegated      Failed - Phenobarbital in normal range and within 360 days    No results found for: "LABPHEN", "PHENOBARB"       Passed - ALT in normal range and within 360 days    ALT  Date Value Ref Range Status  10/26/2022 35 0 - 44 IU/L Final         Passed - AST in normal range and within 360 days    AST  Date Value Ref Range Status  10/26/2022 29 0 - 40 IU/L Final         Passed - Cr in normal range and within 360 days    Creatinine, Ser  Date Value Ref Range Status  12/07/2022 1.21 0.76 - 1.27 mg/dL Final         Passed - HCT in normal range and within 360 days    Hematocrit  Date Value Ref Range Status  10/26/2022 46.3 37.5 - 51.0 % Final         Passed - HGB in normal range and within 360 days    Hemoglobin  Date Value Ref Range Status  10/26/2022 16.3 13.0 - 17.7 g/dL Final         Passed - WBC in normal range and within 360 days    WBC  Date Value Ref Range Status  10/26/2022 8.0 3.4 - 10.8 x10E3/uL Final         Passed - PLT in normal range and within 360 days    Platelets  Date Value Ref Range Status  10/26/2022 241 150 - 450 x10E3/uL Final         Passed - Completed PHQ-2 or PHQ-9 in the last 360 days      Passed - Patient is not pregnant      Passed - Valid encounter within last 12 months    Recent Outpatient Visits           5 days ago New onset of headaches after age 89    [place] [facility] Jackolyn Confer, MD   2 months ago Acute bilateral low back pain without sciatica   [place] [facility] [place], Dorie Rank, NP   4 months ago Primary hypertension   [place] [facility] [place], Eugene T, NP   6 months ago Seizure disorder [facility])   [place] [facility] [place], Corrie Dandy T, NP   1 year ago Seizure disorder [facility])   [place] [facility] [place], Dorie Rank, NP       Future Appointments             In 1 week Elie Goody, MD [facility]   In 1 month [place], Dorie Rank, NP [place] [facility]

## 2023-05-01 ENCOUNTER — Encounter: Payer: Self-pay | Admitting: Pediatrics

## 2023-05-03 ENCOUNTER — Ambulatory Visit: Payer: BC Managed Care – PPO | Admitting: Pediatrics

## 2023-05-03 ENCOUNTER — Encounter: Payer: Self-pay | Admitting: Pediatrics

## 2023-05-03 VITALS — BP 149/95 | HR 77 | Temp 98.2°F | Ht 70.0 in | Wt 204.8 lb

## 2023-05-03 DIAGNOSIS — R519 Headache, unspecified: Secondary | ICD-10-CM

## 2023-05-03 DIAGNOSIS — I10 Essential (primary) hypertension: Secondary | ICD-10-CM | POA: Diagnosis not present

## 2023-05-03 DIAGNOSIS — G40909 Epilepsy, unspecified, not intractable, without status epilepticus: Secondary | ICD-10-CM

## 2023-05-03 DIAGNOSIS — R42 Dizziness and giddiness: Secondary | ICD-10-CM

## 2023-05-03 NOTE — Patient Instructions (Addendum)
Take nurtec when you feel the headache come on. If it works, let me know and will send further prescriptions.  Measure your blood pressure at home! Home Blood Pressure Monitoring is an important part of managing blood pressure and thought to be more accurate than the measures we get in the clinic.  Here's some tips on how to take your blood pressure accurately at home and some highly rated monitors. Most insurances (except for Medicaid) won't pay for monitors, so unfortunately they are an out-of-pocket expense for most people.  Taking an accurate blood pressure measurement: To get an accurate blood pressure reading, empty your bladder first, then rest in a seated position for at least 5 minutes. Ideally, no caffeine or tobacco use in last 30 minutes. Use an arm cuff (not wrist - see recommendations below) seated in a chair with a back next to a table or object that is high enough that you can rest your arm so the blood pressure cuff is at the level of your heart and you can lean back comfortably. Keep both feet on the floor and don't talk while the machine is working. Checking at different times of the day can be helpful to get an idea of your average numbers. Your goal blood pressure should be <140/90. BP Monitor Ratings: Here are some top rated Blood Pressure Monitors as tested by Consumer Reports (accessed January, 2024): (*BB) Best buy = highly rated with lower price Rating Brand/Model Estimated Cost  86 Omron Platinum BP5450 $79  85 Omron Silver BP5250 (*BB)  $53  84 Omron 10 Series BP7450 $92  83 Omron Evolv BP7000 $110  81 [employer] Medical [id number] $52  80 Omron 3 Series BP7100 $50  78 iHealth [id number] $40  76 Up & Up (Target) Automatic Upper Arm 48-554 $30  Note: all units listed above are arm cuffs which are more accurate than wrist cuffs. For more information (and the source of the below info-graphic on how to get set up to get an accurate reading) - check out:  SuperiorMarketers.be 0

## 2023-05-03 NOTE — Progress Notes (Unsigned)
Office Visit  BP (!) 149/95 (BP Location: Right Arm, Patient Position: Sitting, Cuff Size: Large)   Pulse 77   Temp 98.2 F (36.8 C) (Oral)   Ht 5\' 10"  (1.778 m)   Wt 204 lb 12.8 oz (92.9 kg)   SpO2 94%   BMI 29.39 kg/m    Subjective:    Patient ID: Dillon Blake, male    DOB: 1966-01-12, 57 y.o.   MRN: [id number]  HPI: Dillon Blake is a 57 y.o. male  Chief Complaint  Patient presents with   Headache    Can become worse depending on the day and activity, wake up to headaches    lab results     MRI   Discussed the use of AI scribe software for clinical note transcription with the patient, who gave verbal consent to proceed.  History of Present Illness   The patient, with a history of seizures and glaucoma, presents with recurrent headaches. The headaches are primarily located in the temples and behind the eyes, often occurring in the morning and sometimes in the evening, particularly after driving. The patient reports needing to focus more while driving and feeling a bit off, with a possible decrease in peripheral judgment compared to a year ago. He denies any recent changes in vision, as confirmed by a recent eye exam.  The patient also reports a history of vertigo related to an inner ear infection, treated with prednisone and head maneuvers, but has not experienced any episodes in the past 10-12 years. He has not noticed any recent changes in TMJ symptoms, which he manages by avoiding certain foods and activities.  The patient has been on phenobarbital since 1987 for seizure control, with the last seizure occurring in 1991. He also takes lisinopril for blood pressure control, which was recently increased from 5 to 10 mg. The patient checks his blood pressure at home, which is typically less than 140/90.  The patient also reports a daily alcohol intake of 2-3 ounces of whiskey in the evenings. He has not noticed any correlation between alcohol intake and headache  occurrence.  The patient's headaches can last up to 12 hours, and he often manages them by closing his eyes and resting. He has noticed that his headaches can make him feel out of sorts, similar to the feeling of having consumed alcohol. He has not noticed any correlation between his headaches and light sensitivity, although he reports a general sensitivity to light.      Relevant past medical, surgical, family and social history reviewed and updated as indicated. Interim medical history since our last visit reviewed. Allergies and medications reviewed and updated.  ROS per HPI unless specifically indicated above     Objective:    BP (!) 149/95 (BP Location: Right Arm, Patient Position: Sitting, Cuff Size: Large)   Pulse 77   Temp 98.2 F (36.8 C) (Oral)   Ht 5\' 10"  (1.778 m)   Wt 204 lb 12.8 oz (92.9 kg)   SpO2 94%   BMI 29.39 kg/m   Wt Readings from Last 3 Encounters:  05/03/23 204 lb 12.8 oz (92.9 kg)  04/25/23 202 lb 6.4 oz (91.8 kg)  02/15/23 211 lb (95.7 kg)     Physical Exam Constitutional:      Appearance: Normal appearance.  HENT:     Head: Normocephalic and atraumatic.     Jaw: No tenderness.     Comments: No temporal head tenderness Eyes:     General: No  visual field deficit.    Pupils: Pupils are equal, round, and reactive to light.  Cardiovascular:     Rate and Rhythm: Normal rate and regular rhythm.     Pulses: Normal pulses.     Heart sounds: Normal heart sounds.  Pulmonary:     Effort: Pulmonary effort is normal.     Breath sounds: Normal breath sounds.  Abdominal:     General: Abdomen is flat.     Palpations: Abdomen is soft.  Musculoskeletal:        General: Normal range of motion.     Cervical back: Normal range of motion.  Skin:    General: Skin is warm and dry.     Capillary Refill: Capillary refill takes less than 2 seconds.  Neurological:     General: No focal deficit present.     Mental Status: He is alert. Mental status is at baseline.      Cranial Nerves: No cranial nerve deficit or dysarthria.     Sensory: No sensory deficit.     Motor: No weakness.     Coordination: Romberg sign negative. Coordination normal.     Comments: Negative Agapito Games bilaterally  Psychiatric:        Mood and Affect: Mood normal.        Behavior: Behavior normal.         04/25/2023    4:23 PM 02/15/2023    4:01 PM 10/26/2022    3:16 PM 09/04/2021    2:33 PM 02/03/2021    2:50 PM  Depression screen PHQ 2/9  Decreased Interest 0 0 0  0  Down, Depressed, Hopeless 0 0 0  0  PHQ - 2 Score 0 0 0  0  Altered sleeping 0 1 1 3    Tired, decreased energy 0 0 0 0   Change in appetite 0 0 0 0   Feeling bad or failure about yourself  0 0 0 0   Trouble concentrating 0 0 0 0   Moving slowly or fidgety/restless 0 0 0 0   Suicidal thoughts 0 0 0 0   PHQ-9 Score 0 1 1    Difficult doing work/chores Not difficult at all Not difficult at all Not difficult at all         04/25/2023    4:23 PM 02/15/2023    4:02 PM 10/26/2022    3:17 PM 09/04/2021    2:33 PM  GAD 7 : Generalized Anxiety Score  Nervous, Anxious, on Edge 0 0 0 0  Control/stop worrying 0 0 0 0  Worry too much - different things 0 0 0 0  Trouble relaxing 0 0 1 0  Restless 0 0 0 0  Easily annoyed or irritable 0 0 1 1  Afraid - awful might happen 0 0 0 0  Total GAD 7 Score 0 0 2 1  Anxiety Difficulty Not difficult at all Not difficult at all Not difficult at all Not difficult at all       Assessment & Plan:  Assessment & Plan   Dizziness Headache above the eye region Frequent headaches, predominantly in the morning and after driving. No clear triggers identified. No improvement with increased blood pressure medication. Possible migraine and/or headache from poorly controlled HTN. Will r/o GCA with labs as below. History of seizures, but last seizure was in 1991. Has not seen neurologist in years, may benefit from evaluation to determine ongoing phenobarbital use. Will also send  folate and b12  levels given dizziness and heavy alcohol use. -Trial of Nurtec as needed when headache starts. 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 He would like to see ENT. -     Ambulatory referral to ENT  Seizure disorder [facility]) Will repeat phenobarb level in case subtherapeutic and contributing to dizziness/headaches. -     Phenobarbital level -     Ambulatory referral to Neurology  Primary hypertension On Lisinopril. Recent increase in dose with no noticeable change in symptoms. Home readings typically less than 140/90. May be contributing to above. If still elevated, consider increasing. -Continue current dose of Lisinopril. -Monitor blood pressure at home and report if consistently above 140/90.  Follow up plan: Return if symptoms worsen or fail to improve.  Rhea Kaelin Howell Pringle, MD

## 2023-05-04 LAB — SEDIMENTATION RATE: Sed Rate: 13 mm/h (ref 0–30)

## 2023-05-04 LAB — PHENOBARBITAL LEVEL: Phenobarbital, Serum: 14 ug/mL — ABNORMAL LOW (ref 15–40)

## 2023-05-04 LAB — C-REACTIVE PROTEIN: CRP: 5 mg/L (ref 0–10)

## 2023-05-04 LAB — FOLATE: Folate: 16.1 ng/mL (ref >3.0)

## 2023-05-04 LAB — VITAMIN B12: Vitamin B-12: 777 pg/mL (ref 232–1245)

## 2023-05-07 ENCOUNTER — Encounter: Payer: Self-pay | Admitting: Pediatrics

## 2023-05-08 ENCOUNTER — Ambulatory Visit: Payer: BC Managed Care – PPO | Admitting: Dermatology

## 2023-05-31 DIAGNOSIS — H9042 Sensorineural hearing loss, unilateral, left ear, with unrestricted hearing on the contralateral side: Secondary | ICD-10-CM | POA: Diagnosis not present

## 2023-05-31 DIAGNOSIS — R42 Dizziness and giddiness: Secondary | ICD-10-CM | POA: Diagnosis not present

## 2023-06-09 DIAGNOSIS — R519 Headache, unspecified: Secondary | ICD-10-CM | POA: Insufficient documentation

## 2023-06-09 NOTE — Patient Instructions (Signed)
Recombinant Zoster (Shingles) Vaccine: What You Need to Know Many vaccine information statements are available in Spanish and other languages. See PromoAge.com.br. 1. Why get vaccinated? Recombinant zoster (shingles) vaccine can prevent shingles. Shingles (also called herpes zoster, or just zoster) is a painful skin rash, usually with blisters. In addition to the rash, shingles can cause fever, headache, chills, or upset stomach. Rarely, shingles can lead to complications such as pneumonia, hearing problems, blindness, brain inflammation (encephalitis), or death. The risk of shingles increases with age. The most common complication of shingles is long-term nerve pain called postherpetic neuralgia (PHN). PHN occurs in the areas where the shingles rash was and can last for months or years after the rash goes away. The pain from PHN can be severe and debilitating. The risk of PHN increases with age. An older adult with shingles is more likely to develop PHN and have longer lasting and more severe pain than a younger person. People with weakened immune systems also have a higher risk of getting shingles and complications from the disease. Shingles is caused by varicella-zoster virus, the same virus that causes chickenpox. After you have chickenpox, the virus stays in your body and can cause shingles later in life. Shingles cannot be passed from one person to another, but the virus that causes shingles can spread and cause chickenpox in someone who has never had chickenpox or has never received chickenpox vaccine. 2. Recombinant shingles vaccine Recombinant shingles vaccine provides strong protection against shingles. By preventing shingles, recombinant shingles vaccine also protects against PHN and other complications. Recombinant shingles vaccine is recommended for: Adults 50 years and older Adults 19 years and older who have a weakened immune system because of disease or treatments Shingles vaccine  is given as a two-dose series. For most people, the second dose should be given 2 to 6 months after the first dose. Some people who have or will have a weakened immune system can get the second dose 1 to 2 months after the first dose. Ask your health care provider for guidance. People who have had shingles in the past and people who have received varicella (chickenpox) vaccine are recommended to get recombinant shingles vaccine. The vaccine is also recommended for people who have already gotten another type of shingles vaccine, the live shingles vaccine. There is no live virus in recombinant shingles vaccine. Shingles vaccine may be given at the same time as other vaccines. 3. Talk with your health care provider Tell your vaccination provider if the person getting the vaccine: Has had an allergic reaction after a previous dose of recombinant shingles vaccine, or has any severe, life-threatening allergies Is currently experiencing an episode of shingles Is pregnant In some cases, your health care provider may decide to postpone shingles vaccination until a future visit. People with minor illnesses, such as a cold, may be vaccinated. People who are moderately or severely ill should usually wait until they recover before getting recombinant shingles vaccine. Your health care provider can give you more information. 4. Risks of a vaccine reaction A sore arm with mild or moderate pain is very common after recombinant shingles vaccine. Redness and swelling can also happen at the site of the injection. Tiredness, muscle pain, headache, shivering, fever, stomach pain, and nausea are common after recombinant shingles vaccine. These side effects may temporarily prevent a vaccinated person from doing regular activities. Symptoms usually go away on their own in 2 to 3 days. You should still get the second dose of recombinant shingles vaccine  even if you had one of these reactions after the first  dose. Guillain-Barr syndrome (GBS), a serious nervous system disorder, has been reported very rarely after recombinant zoster vaccine. People sometimes faint after medical procedures, including vaccination. Tell your provider if you feel dizzy or have vision changes or ringing in the ears. As with any medicine, there is a very remote chance of a vaccine causing a severe allergic reaction, other serious injury, or death. 5. What if there is a serious problem? An allergic reaction could occur after the vaccinated person leaves the clinic. If you see signs of a severe allergic reaction (hives, swelling of the face and throat, difficulty breathing, a fast heartbeat, dizziness, or weakness), call 9-1-1 and get the person to the nearest hospital. For other signs that concern you, call your health care provider. Adverse reactions should be reported to the Vaccine Adverse Event Reporting System (VAERS). Your health care provider will usually file this report, or you can do it yourself. Visit the VAERS website at www.vaers.LAgents.no or call (424)557-8947. VAERS is only for reporting reactions, and VAERS staff members do not give medical advice. 6. How can I learn more? Ask your health care provider. Call your local or state health department. Visit the website of the Food and Drug Administration (FDA) for vaccine package inserts and additional information at GoldCloset.com.ee. Contact the Centers for Disease Control and Prevention (CDC): Call 3203662500 (1-800-CDC-INFO) or Visit CDC's website at PicCapture.uy. Source: CDC Vaccine Information Statement Recombinant Zoster Vaccine (07/29/2020) This same material is available at FootballExhibition.com.br for no charge. This information is not intended to replace advice given to you by your health care provider. Make sure you discuss any questions you have with your health care provider. Document Revised: 09/26/2022 Document Reviewed:  06/27/2022 Elsevier Patient Education  2024 [employer].

## 2023-06-14 ENCOUNTER — Ambulatory Visit: Payer: BC Managed Care – PPO | Admitting: Nurse Practitioner

## 2023-06-14 VITALS — BP 159/82 | HR 80 | Temp 98.0°F | Ht 70.0 in | Wt 212.2 lb

## 2023-06-14 DIAGNOSIS — E7800 Pure hypercholesterolemia, unspecified: Secondary | ICD-10-CM

## 2023-06-14 DIAGNOSIS — R519 Headache, unspecified: Secondary | ICD-10-CM

## 2023-06-14 DIAGNOSIS — G40909 Epilepsy, unspecified, not intractable, without status epilepticus: Secondary | ICD-10-CM

## 2023-06-14 DIAGNOSIS — I10 Essential (primary) hypertension: Secondary | ICD-10-CM

## 2023-06-14 DIAGNOSIS — Z23 Encounter for immunization: Secondary | ICD-10-CM

## 2023-06-14 MED ORDER — LISINOPRIL 20 MG PO TABS: 20.0000 mg | ORAL_TABLET | Freq: Every day | ORAL | 4 refills | Status: DC

## 2023-06-14 NOTE — Progress Notes (Signed)
BP (!) 159/82   Pulse 80   Temp 98 F (36.7 C) (Oral)   Ht 5\' 10"  (1.778 m)   Wt 212 lb 3.2 oz (96.3 kg)   SpO2 98%   BMI 30.45 kg/m    Subjective:    Patient ID: Dillon Blake, male    DOB: 1966/01/28, 57 y.o.   MRN: [id number]  HPI: Dillon Blake is a 57 y.o. male  Chief Complaint  Patient presents with   Hypertension    Patient states his BP has been running high lately   Hyperlipidemia   HYPERTENSION / HYPERLIPIDEMIA + HEADACHES Continues on Lisinopril 10 MG daily, no statin.  Saw Dr. Andee Poles, ENT, is scheduled for further testing.  Was placed on steroids. Continues to have headache to left side. Satisfied with current treatment? yes Duration of hypertension: chronic BP monitoring frequency: 2-3 times a week BP range: 150-160/90 range BP medication side effects: no Duration of hyperlipidemia: chronic Cholesterol supplements: none Aspirin: no Recent stressors: yes Recurrent headaches: yes Visual changes: no Palpitations: no Dyspnea: no Chest pain: no Lower extremity edema: no Dizzy/lightheaded: sometimes  The 10-year ASCVD risk score (Arnett DK, et al., 2019) is: 10.5%   Values used to calculate the score:     Age: 76 years     Sex: Male     Is Non-Hispanic African American: No     Diabetic: No     Tobacco smoker: No     Systolic Blood Pressure: 159 mmHg     Is BP treated: Yes     HDL Cholesterol: 45 mg/dL     Total Cholesterol: 201 mg/dL   Relevant past medical, surgical, family and social history reviewed and updated as indicated. Interim medical history since our last visit reviewed. Allergies and medications reviewed and updated.  Review of Systems  Constitutional:  Negative for activity change, diaphoresis, fatigue and fever.  Respiratory:  Negative for cough, chest tightness, shortness of breath and wheezing.   Cardiovascular:  Negative for chest pain, palpitations and leg swelling.  Gastrointestinal:  Negative for abdominal distention,  abdominal pain, constipation, diarrhea, nausea and vomiting.  Endocrine: Negative for cold intolerance, heat intolerance, polydipsia, polyphagia and polyuria.  Musculoskeletal: Negative.   Skin: Negative.   Neurological:  Negative for dizziness, syncope, weakness, light-headedness, numbness and headaches.  Psychiatric/Behavioral: Negative.      Per HPI unless specifically indicated above     Objective:    BP (!) 159/82   Pulse 80   Temp 98 F (36.7 C) (Oral)   Ht 5\' 10"  (1.778 m)   Wt 212 lb 3.2 oz (96.3 kg)   SpO2 98%   BMI 30.45 kg/m   Wt Readings from Last 3 Encounters:  06/14/23 212 lb 3.2 oz (96.3 kg)  05/03/23 204 lb 12.8 oz (92.9 kg)  04/25/23 202 lb 6.4 oz (91.8 kg)    Physical Exam Vitals and nursing note reviewed.  Constitutional:      General: He is awake. He is not in acute distress.    Appearance: He is well-developed and well-groomed. He is obese. He is not ill-appearing or toxic-appearing.  HENT:     Head: Normocephalic.     Right Ear: Hearing and external ear normal.     Left Ear: Hearing and external ear normal.  Eyes:     General: Lids are normal.     Extraocular Movements: Extraocular movements intact.     Conjunctiva/sclera: Conjunctivae normal.  Neck:     Thyroid:  No thyromegaly.     Vascular: No carotid bruit.  Cardiovascular:     Rate and Rhythm: Normal rate and regular rhythm.     Heart sounds: Normal heart sounds. No murmur heard.    No gallop.  Pulmonary:     Effort: No accessory muscle usage or respiratory distress.     Breath sounds: Normal breath sounds.  Abdominal:     General: Bowel sounds are normal. There is no distension.     Palpations: Abdomen is soft.     Tenderness: There is no abdominal tenderness.  Musculoskeletal:     Cervical back: Full passive range of motion without pain.     Right lower leg: No edema.     Left lower leg: No edema.  Lymphadenopathy:     Cervical: No cervical adenopathy.  Skin:    General: Skin is  warm.     Capillary Refill: Capillary refill takes less than 2 seconds.  Neurological:     Mental Status: He is alert and oriented to person, place, and time.     Deep Tendon Reflexes: Reflexes are normal and symmetric.     Reflex Scores:      Brachioradialis reflexes are 2+ on the right side and 2+ on the left side.      Patellar reflexes are 2+ on the right side and 2+ on the left side. Psychiatric:        Attention and Perception: Attention normal.        Mood and Affect: Mood normal.        Speech: Speech normal.        Behavior: Behavior normal. Behavior is cooperative.        Thought Content: Thought content normal.     Results for orders placed or performed in visit on 05/03/23  B12   Collection Time: 05/03/23  4:15 PM  Result Value Ref Range   Vitamin B-12 777 232 - 1,245 pg/mL  Folate   Collection Time: 05/03/23  4:15 PM  Result Value Ref Range   Folate 16.1 >3.0 ng/mL  C-reactive protein   Collection Time: 05/03/23  4:15 PM  Result Value Ref Range   CRP 5 0 - 10 mg/L  Sed Rate (ESR)   Collection Time: 05/03/23  4:15 PM  Result Value Ref Range   Sed Rate 13 0 - 30 mm/hr  Phenobarbital level   Collection Time: 05/03/23  4:15 PM  Result Value Ref Range   Phenobarbital, Serum 14 (L) 15 - 40 ug/mL      Assessment & Plan:   Problem List Items Addressed This Visit       Cardiovascular and Mediastinum   Hypertension - Primary   Chronic, stable.  BP trending up on levels in office and at home. Increase Lisinopril to 20 MG daily, educated him on this change.  Recommend he check BP at home at least three mornings a week.  LABS: BMP.  Focus on DASH diet at home.  ?part of reason for recent headaches, elevation in BP.      Relevant Medications   lisinopril (ZESTRIL) 20 MG tablet   Other Relevant Orders   Basic metabolic panel     Other   Elevated LDL cholesterol level   Continue diet focus at this time, ASCVD 10.5%, recheck lipid panel today == he is fasting.       Relevant Orders   Lipid Panel w/o Chol/HDL Ratio   New onset of headaches after age 19  Ongoing, is following with ENT at present and having further work-up with them prior to going to neurology.  Recent imaging reassuring.        Follow up plan: Return in about 4 weeks (around 07/12/2023) for HTN - increased Lisinopril to 20 MG.

## 2023-06-14 NOTE — Assessment & Plan Note (Signed)
Chronic, stable.  BP trending up on levels in office and at home. Increase Lisinopril to 20 MG daily, educated him on this change.  Recommend he check BP at home at least three mornings a week.  LABS: BMP.  Focus on DASH diet at home.  ?part of reason for recent headaches, elevation in BP.

## 2023-06-14 NOTE — Assessment & Plan Note (Addendum)
Continue diet focus at this time, ASCVD 10.5%, recheck lipid panel today == he is fasting.

## 2023-06-14 NOTE — Assessment & Plan Note (Signed)
Ongoing, is following with ENT at present and having further work-up with them prior to going to neurology.  Recent imaging reassuring.

## 2023-06-15 LAB — BASIC METABOLIC PANEL
BUN/Creatinine Ratio: 18 (ref 9–20)
BUN: 19 mg/dL (ref 6–24)
CO2: 24 mmol/L (ref 20–29)
Calcium: 8.7 mg/dL (ref 8.7–10.2)
Chloride: 104 mmol/L (ref 96–106)
Creatinine, Ser: 1.04 mg/dL (ref 0.76–1.27)
Glucose: 95 mg/dL (ref 70–99)
Potassium: 4.7 mmol/L (ref 3.5–5.2)
Sodium: 142 mmol/L (ref 134–144)
eGFR: 85 mL/min/{1.73_m2} (ref >59)

## 2023-06-15 LAB — LIPID PANEL W/O CHOL/HDL RATIO
Cholesterol, Total: 209 mg/dL — ABNORMAL HIGH (ref 100–199)
HDL: 55 mg/dL (ref >39)
LDL Chol Calc (NIH): 136 mg/dL — ABNORMAL HIGH (ref 0–99)
Triglycerides: 103 mg/dL (ref 0–149)
VLDL Cholesterol Cal: 18 mg/dL (ref 5–40)

## 2023-06-15 NOTE — Progress Notes (Signed)
Contacted via MyChart The 10-year ASCVD risk score (Arnett DK, et al., 2019) is: 9.2%   Values used to calculate the score:     Age: 57 years     Sex: Male     Is Non-Hispanic African American: No     Diabetic: No     Tobacco smoker: No     Systolic Blood Pressure: 159 mmHg     Is BP treated: Yes     HDL Cholesterol: 55 mg/dL     Total Cholesterol: 209 mg/dL   Good morning Dillon Blake, your labs have returned and overall remain at baseline.  Lipid panel continues to show elevations to levels where medication is recommended.  We will check again next visit and discuss further.  Any questions? Keep being amazing!!  Thank you for allowing me to participate in your care.  I appreciate you. Kindest regards, Alan Riles

## 2023-06-24 DIAGNOSIS — R42 Dizziness and giddiness: Secondary | ICD-10-CM | POA: Diagnosis not present

## 2023-07-06 NOTE — Patient Instructions (Signed)

## 2023-07-12 ENCOUNTER — Ambulatory Visit: Payer: BC Managed Care – PPO | Admitting: Nurse Practitioner

## 2023-07-12 ENCOUNTER — Encounter: Payer: Self-pay | Admitting: Nurse Practitioner

## 2023-07-12 VITALS — BP 130/78 | HR 76 | Temp 98.1°F | Ht 70.0 in | Wt 208.0 lb

## 2023-07-12 DIAGNOSIS — I10 Essential (primary) hypertension: Secondary | ICD-10-CM

## 2023-07-12 MED ORDER — LISINOPRIL 20 MG PO TABS: 20.0000 mg | ORAL_TABLET | Freq: Every day | ORAL | 4 refills | Status: AC

## 2023-07-12 NOTE — Assessment & Plan Note (Addendum)
Chronic, stable.  BP improved today in office, some elevations at home -- request he bring cuff next visit. Continue Lisinopril 20 MG daily.  Recommend he check BP at home at least three mornings a week.  LABS: up to date.  Focus on DASH diet at home.  ?part of reason for recent headaches, elevation in BP levels recently.  If another BP medication is needed could consider HCTZ as ENT believes he may have Meniere's and this may offer benefit to it.

## 2023-07-12 NOTE — Progress Notes (Signed)
BP 130/78   Pulse 76   Temp 98.1 F (36.7 C) (Oral)   Ht 5\' 10"  (1.778 m)   Wt 208 lb (94.3 kg)   SpO2 98%   BMI 29.84 kg/m    Subjective:    Patient ID: Dillon Blake, male    DOB: [birth year], 58 y.o.   MRN: [id number]  HPI: Dillon Blake is a 58 y.o. male  Chief Complaint  Patient presents with   Hypertension   HYPERTENSION without Chronic Kidney Disease Follow-up today for increase in Lisinopril to 20 MG last visit. Hypertension status: stable  Satisfied with current treatment? yes Duration of hypertension: chronic BP monitoring frequency:  three times a week ([facility]) BP range: 140/90 - older machine BP medication side effects:  no Medication compliance: good compliance Aspirin: no Recurrent headaches: no -- no further since taking steroids Visual changes: no Palpitations: no Dyspnea: no Chest pain: no Lower extremity edema: no Dizzy/lightheaded: ENT thinks he may have Meniere's  Relevant past medical, surgical, family and social history reviewed and updated as indicated. Interim medical history since our last visit reviewed. Allergies and medications reviewed and updated.  Review of Systems  Constitutional:  Negative for activity change, diaphoresis, fatigue and fever.  Respiratory:  Negative for cough, chest tightness, shortness of breath and wheezing.   Cardiovascular:  Negative for chest pain, palpitations and leg swelling.  Gastrointestinal: Negative.   Musculoskeletal: Negative.   Skin: Negative.   Neurological: Negative.   Psychiatric/Behavioral: Negative.      Per HPI unless specifically indicated above     Objective:    BP 130/78   Pulse 76   Temp 98.1 F (36.7 C) (Oral)   Ht 5\' 10"  (1.778 m)   Wt 208 lb (94.3 kg)   SpO2 98%   BMI 29.84 kg/m   Wt Readings from Last 3 Encounters:  07/12/23 208 lb (94.3 kg)  06/14/23 212 lb 3.2 oz (96.3 kg)  05/03/23 204 lb 12.8 oz (92.9 kg)    Physical Exam Vitals and nursing note reviewed.   Constitutional:      General: He is awake. He is not in acute distress.    Appearance: Normal appearance. He is well-developed and well-groomed. He is not ill-appearing or toxic-appearing.  HENT:     Head: Normocephalic.     Right Ear: Hearing and external ear normal.     Left Ear: Hearing and external ear normal.  Eyes:     General: Lids are normal.     Extraocular Movements: Extraocular movements intact.     Conjunctiva/sclera: Conjunctivae normal.  Neck:     Thyroid: No thyromegaly.     Vascular: No carotid bruit.  Cardiovascular:     Rate and Rhythm: Normal rate and regular rhythm.     Heart sounds: Normal heart sounds. No murmur heard.    No gallop.  Pulmonary:     Effort: No accessory muscle usage or respiratory distress.     Breath sounds: Normal breath sounds.  Abdominal:     General: Bowel sounds are normal. There is no distension.     Palpations: Abdomen is soft.     Tenderness: There is no abdominal tenderness.  Musculoskeletal:     Cervical back: Full passive range of motion without pain.     Right lower leg: No edema.     Left lower leg: No edema.  Lymphadenopathy:     Cervical: No cervical adenopathy.  Skin:    General: Skin is warm.  Capillary Refill: Capillary refill takes less than 2 seconds.  Neurological:     Mental Status: He is alert and oriented to person, place, and time.     Deep Tendon Reflexes: Reflexes are normal and symmetric.     Reflex Scores:      Brachioradialis reflexes are 2+ on the right side and 2+ on the left side.      Patellar reflexes are 2+ on the right side and 2+ on the left side. Psychiatric:        Attention and Perception: Attention normal.        Mood and Affect: Mood normal.        Speech: Speech normal.        Behavior: Behavior normal. Behavior is cooperative.        Thought Content: Thought content normal.     Results for orders placed or performed in visit on 06/14/23  Basic metabolic panel   Collection Time:  06/14/23  9:45 AM  Result Value Ref Range   Glucose 95 70 - 99 mg/dL   BUN 19 6 - 24 mg/dL   Creatinine, Ser 1.61 0.76 - 1.27 mg/dL   eGFR 85 >09 UE/AVW/0.98   BUN/Creatinine Ratio 18 9 - 20   Sodium 142 134 - 144 mmol/L   Potassium 4.7 3.5 - 5.2 mmol/L   Chloride 104 96 - 106 mmol/L   CO2 24 20 - 29 mmol/L   Calcium 8.7 8.7 - 10.2 mg/dL  Lipid Panel w/o Chol/HDL Ratio   Collection Time: 06/14/23  9:45 AM  Result Value Ref Range   Cholesterol, Total 209 (H) 100 - 199 mg/dL   Triglycerides 119 0 - 149 mg/dL   HDL 55 >14 mg/dL   VLDL Cholesterol Cal 18 5 - 40 mg/dL   LDL Chol Calc (NIH) 782 (H) 0 - 99 mg/dL      Assessment & Plan:   Problem List Items Addressed This Visit       Cardiovascular and Mediastinum   Hypertension - Primary   Chronic, stable.  BP improved today in office, some elevations at home -- request he bring cuff next visit. Continue Lisinopril 20 MG daily.  Recommend he check BP at home at least three mornings a week.  LABS: up to date.  Focus on DASH diet at home.  ?part of reason for recent headaches, elevation in BP levels recently.  If another BP medication is needed could consider HCTZ as ENT believes he may have Meniere's and this may offer benefit to it.      Relevant Medications   lisinopril (ZESTRIL) 20 MG tablet     Follow up plan: Return in about 5 months (around 12/10/2023) for Annual Physical.

## 2023-10-25 ENCOUNTER — Ambulatory Visit (INDEPENDENT_AMBULATORY_CARE_PROVIDER_SITE_OTHER)

## 2023-10-25 ENCOUNTER — Ambulatory Visit
Admission: RE | Admit: 2023-10-25 | Discharge: 2023-10-25 | Disposition: A | Discharge status: Home / Self Care | Source: Ambulatory Visit | Attending: Emergency Medicine | Admitting: Emergency Medicine

## 2023-10-25 VITALS — BP 155/99 | HR 71 | Temp 98.4°F | Resp 15 | Ht 70.0 in | Wt 207.9 lb

## 2023-10-25 DIAGNOSIS — S90122A Contusion of left lesser toe(s) without damage to nail, initial encounter: Secondary | ICD-10-CM | POA: Diagnosis not present

## 2023-10-25 DIAGNOSIS — M79675 Pain in left toe(s): Secondary | ICD-10-CM

## 2023-10-25 DIAGNOSIS — M7989 Other specified soft tissue disorders: Secondary | ICD-10-CM | POA: Diagnosis not present

## 2023-10-25 NOTE — Discharge Instructions (Addendum)
 Your x-rays did not show any evidence of fracture or dislocation.  I do believe that you have bruised your toe, though the mechanism is unclear.  Keep your left foot elevated is much as possible to help decrease pain and inflammation.  You may apply ice to your toe for 20-minute at a time, 2-3 times a day, as needed for pain and inflammation.  Use over-the-counter Tylenol  and/or ibuprofen according the package instructions as needed for pain or inflammation.  If you develop any new or worsening symptoms please return for reevaluation or see your primary care provider.

## 2023-10-25 NOTE — ED Triage Notes (Signed)
 Patient states that he woke up yesterday morning with bruising on his left 3rd toe.  Patient does reports some pain in that toe.  Patient denies injury or fall.

## 2023-10-25 NOTE — ED Provider Notes (Addendum)
 [facility]    CSN: [id number] Arrival date & time: 10/25/23  1541      History   Chief Complaint Chief Complaint  Patient presents with   Toe Pain    Appointment    HPI Dillon Blake is a 58 y.o. male.   HPI  58 year old male with past medical history significant for ED, hypertension, and seizure disorder presents for pain and bruising at the end of his left third toe.  He reports that he woke up yesterday morning and noticed the bruising and then had pain when he stood up to put weight on it.  He reports that the pain has improved but is still discolored.  He does not remember any injury.  He does go to the gym where he rides on the spin bike and also does free weights.  Past Medical History:  Diagnosis Date   Allergy    ED (erectile dysfunction)    Impotence, organic    Rib injury    Seizure disorder [facility])     Patient Active Problem List   Diagnosis Date Noted   New onset of headaches after age 64 06/09/2023   Low back pain 12/07/2022   Right leg pain 12/07/2022   Family history of diabetes mellitus in mother 02/03/2021   Multiple acquired skin tags 02/03/2021   Healthcare maintenance 02/03/2021   Tendinitis, de Quervain's 08/06/2019   Elevated LDL cholesterol level 06/28/2019   Medication monitoring encounter 05/22/2017   Hypertension 05/09/2016   Seizure disorder (HCC) 03/31/2015   Allergic rhinitis 03/31/2015   ED (erectile dysfunction) 03/31/2015    Past Surgical History:  Procedure Laterality Date   TONSILLECTOMY  1972       Home Medications    Prior to Admission medications   Medication Sig Start Date End Date Taking? Authorizing Provider  latanoprost (XALATAN) 0.005 % ophthalmic solution 1 drop at bedtime.   Yes [provider]  lisinopril  (ZESTRIL ) 20 MG tablet Take 1 tablet (20 mg total) by mouth daily. 07/12/23  Yes Cannady, Jolene T, NP  Multiple Vitamin (MULTIVITAMIN) tablet Take 1 tablet by mouth daily.   Yes  [provider]  PHENobarbital  (LUMINAL) 97.2 MG tablet TAKE 1 AND 1/2 TABS AT BEDTIME 05/01/23  Yes Cannady, Jolene T, NP  tadalafil  (CIALIS ) 20 MG tablet TAKE 1 TABLET BY MOUTH EVERY DAY AS NEEDED 03/29/23   Cannady, Jolene T, NP    Family History Family History  Problem Relation Age of Onset   Arthritis Mother    Lupus Mother    Seizures Mother    Allergies Mother    Bursitis Mother    Cancer Mother        leukemia   Diabetes Mother    Fibromyalgia Mother    Hypertension Father    Diabetes Brother    Diabetes Maternal Grandmother    Stroke Maternal Grandmother    Cancer Maternal Grandfather        bile duct   Heart disease Paternal Grandmother        MI   Cancer Paternal Grandfather        lung    Social History Social History   Tobacco Use   Smoking status: Never   Smokeless tobacco: Never  Vaping Use   Vaping status: Never Used  Substance Use Topics   Alcohol use: Yes    Alcohol/week: 3.0 - 4.0 standard drinks of alcohol    Types: 3 - 4 Cans of beer per week  Drug use: No     Allergies   Keflex [cephalexin] and Penicillin g benzathine   Review of Systems Review of Systems  Constitutional:  Negative for fever.  Musculoskeletal:  Positive for arthralgias and joint swelling.  Skin:  Positive for color change. Negative for wound.     Physical Exam Triage Vital Signs ED Triage Vitals  Encounter Vitals Group     BP      Systolic BP Percentile      Diastolic BP Percentile      Pulse      Resp      Temp      Temp src      SpO2      Weight      Height      Head Circumference      Peak Flow      Pain Score      Pain Loc      Pain Education      Exclude from Growth Chart    No data found.  Updated Vital Signs BP (!) 155/99 (BP Location: Right Arm)   Pulse 71   Temp 98.4 F (36.9 C) (Oral)   Resp 15   Ht 5\' 10"  (1.778 m)   Wt 207 lb 14.3 oz (94.3 kg)   SpO2 96%   BMI 29.83 kg/m   Visual Acuity Right Eye Distance:   Left  Eye Distance:   Bilateral Distance:    Right Eye Near:   Left Eye Near:    Bilateral Near:     Physical Exam Vitals and nursing note reviewed.  Constitutional:      Appearance: Normal appearance.  Musculoskeletal:        General: Tenderness present. No swelling or signs of injury.  Skin:    General: Skin is warm and dry.     Capillary Refill: Capillary refill takes less than 2 seconds.     Findings: Bruising present.  Neurological:     Mental Status: He is alert.      UC Treatments / Results  Labs (all labs ordered are listed, but only abnormal results are displayed) Labs Reviewed - No data to display  EKG   Radiology No results found.  Procedures Procedures (including critical care time)  Medications Ordered in UC Medications - No data to display  Initial Impression / Assessment and Plan / UC Course  I have reviewed the triage vital signs and the nursing notes.  Pertinent labs & imaging results that were available during my care of the patient were reviewed by me and considered in my medical decision making (see chart for details).   Patient is a pleasant, nontoxic-appearing 58 year old male presenting for evaluation of ecchymosis to the distal phalanx of his left third toe that he first noticed yesterday morning.  He did not remember any injury.  It is mildly tender to palpation.  The ecchymosis is only on the dorsal surface around the nail but the area is not hot to touch.  Patient is not remember any injury.  I will obtain radiograph to rule out any bony abnormality.  Left third toe x-ray independently reviewed and evaluated by me.  Impression: No evidence of fracture or dislocation.  Soft tissues are unremarkable.  Radiology overread is pending. Radiology impression states on the lateral view there is a subtle cortical lucency along the base of the distal phalanx of the third toe.  A subtle fracture is possible.  Please correlate with point tenderness.  I  will  discharge patient with a diagnosis of contusion of his left third toe.  He may apply ice to the toe for 20 minutes at a time, 2-3 times a day, as needed for pain and inflammation.  Elevation as often as possible to help decrease pain and inflammation.  Also over-the-counter Tylenol  and/or NSAIDs as needed.  Return precautions reviewed.     Final Clinical Impressions(s) / UC Diagnoses   Final diagnoses:  Toe pain, left  Contusion of lesser toe of left foot without damage to nail, initial encounter     Discharge Instructions      Your x-rays did not show any evidence of fracture or dislocation.  I do believe that you have bruised your toe, though the mechanism is unclear.  Keep your left foot elevated is much as possible to help decrease pain and inflammation.  You may apply ice to your toe for 20-minute at a time, 2-3 times a day, as needed for pain and inflammation.  Use over-the-counter Tylenol  and/or ibuprofen according the package instructions as needed for pain or inflammation.  If you develop any new or worsening symptoms please return for reevaluation or see your primary care provider.     ED Prescriptions   None    PDMP not reviewed this encounter.   Kent Pear, NP 10/25/23 1659    Kent Pear, NP 10/25/23 [phone]

## 2023-11-24 NOTE — Patient Instructions (Signed)
 Be Involved in Caring For Your Health:  Taking Medications When medications are taken as directed, they can greatly improve your health. But if they are not taken as prescribed, they may not work. In some cases, not taking them correctly can be harmful. To help ensure your treatment remains effective and safe, understand your medications and how to take them. Bring your medications to each visit for review by your provider.  Your lab results, notes, and after visit summary will be available on My Chart. We strongly encourage you to use this feature. If lab results are abnormal the clinic will contact you with the appropriate steps. If the clinic does not contact you assume the results are satisfactory. You can always view your results on My Chart. If you have questions regarding your health or results, please contact the clinic during office hours. You can also ask questions on My Chart.  We at [facility] are grateful that you chose [place] to provide your care. We strive to provide evidence-based and compassionate care and are always looking for feedback. If you get a survey from the clinic please complete this so we can hear your opinions.  Heart-Healthy Eating Plan Many factors influence your heart health, including eating and exercise habits. Heart health is also called coronary health. Coronary risk increases with abnormal blood fat (lipid) levels. A heart-healthy eating plan includes limiting unhealthy fats, increasing healthy fats, limiting salt (sodium) intake, and making other diet and lifestyle changes. What is my plan? Your health care provider may recommend that: You limit your fat intake to _________% or less of your total calories each day. You limit your saturated fat intake to _________% or less of your total calories each day. You limit the amount of cholesterol in your diet to less than _________ mg per day. You limit the amount of sodium in your diet to less than _________  mg per day. What are tips for following this plan? Cooking Cook foods using methods other than frying. Baking, boiling, grilling, and broiling are all good options. Other ways to reduce fat include: Removing the skin from poultry. Removing all visible fats from meats. Steaming vegetables in water or broth. Meal planning  At meals, imagine dividing your plate into fourths: Fill one-half of your plate with vegetables and green salads. Fill one-fourth of your plate with whole grains. Fill one-fourth of your plate with lean protein foods. Eat 2-4 cups of vegetables per day. One cup of vegetables equals 1 cup (91 g) broccoli or cauliflower florets, 2 medium carrots, 1 large bell pepper, 1 large sweet potato, 1 large tomato, 1 medium white potato, 2 cups (150 g) raw leafy greens. Eat 1-2 cups of fruit per day. One cup of fruit equals 1 small apple, 1 large banana, 1 cup (237 g) mixed fruit, 1 large orange,  cup (82 g) dried fruit, 1 cup (240 mL) 100% fruit juice. Eat more foods that contain soluble fiber. Examples include apples, broccoli, carrots, beans, peas, and barley. Aim to get 25-30 g of fiber per day. Increase your consumption of legumes, nuts, and seeds to 4-5 servings per week. One serving of dried beans or legumes equals  cup (90 g) cooked, 1 serving of nuts is  oz (12 almonds, 24 pistachios, or 7 walnut halves), and 1 serving of seeds equals  oz (8 g). Fats Choose healthy fats more often. Choose monounsaturated and polyunsaturated fats, such as olive and canola oils, avocado oil, flaxseeds, walnuts, almonds, and seeds. Eat  more omega-3 fats. Choose salmon, mackerel, sardines, tuna, flaxseed oil, and ground flaxseeds. Aim to eat fish at least 2 times each week. Check food labels carefully to identify foods with trans fats or high amounts of saturated fat. Limit saturated fats. These are found in animal products, such as meats, butter, and cream. Plant sources of saturated fats  include palm oil, palm kernel oil, and coconut oil. Avoid foods with partially hydrogenated oils in them. These contain trans fats. Examples are stick margarine, some tub margarines, cookies, crackers, and other baked goods. Avoid fried foods. General information Eat more home-cooked food and less restaurant, buffet, and fast food. Limit or avoid alcohol. Limit foods that are high in added sugar and simple starches such as foods made using white refined flour (white breads, pastries, sweets). Lose weight if you are overweight. Losing just 5-10% of your body weight can help your overall health and prevent diseases such as diabetes and heart disease. Monitor your sodium intake, especially if you have high blood pressure. Talk with your health care provider about your sodium intake. Try to incorporate more vegetarian meals weekly. What foods should I eat? Fruits All fresh, canned (in natural juice), or frozen fruits. Vegetables Fresh or frozen vegetables (raw, steamed, roasted, or grilled). Green salads. Grains Most grains. Choose whole wheat and whole grains most of the time. Rice and pasta, including brown rice and pastas made with whole wheat. Meats and other proteins Lean, well-trimmed beef, veal, pork, and lamb. Chicken and [place] without skin. All fish and shellfish. Wild duck, rabbit, pheasant, and venison. Egg whites or low-cholesterol egg substitutes. Dried beans, peas, lentils, and tofu. Seeds and most nuts. Dairy Low-fat or nonfat cheeses, including ricotta and mozzarella. Skim or 1% milk (liquid, powdered, or evaporated). Buttermilk made with low-fat milk. Nonfat or low-fat yogurt. Fats and oils Non-hydrogenated (trans-free) margarines. Vegetable oils, including soybean, sesame, sunflower, olive, avocado, peanut, safflower, corn, canola, and cottonseed. Salad dressings or mayonnaise made with a vegetable oil. Beverages Water (mineral or sparkling). Coffee and tea. Unsweetened ice  tea. Diet beverages. Sweets and desserts Sherbet, gelatin, and fruit ice. Small amounts of dark chocolate. Limit all sweets and desserts. Seasonings and condiments All seasonings and condiments. The items listed above may not be a complete list of foods and beverages you can eat. Contact a dietitian for more options. What foods should I avoid? Fruits Canned fruit in heavy syrup. Fruit in cream or butter sauce. Fried fruit. Limit coconut. Vegetables Vegetables cooked in cheese, cream, or butter sauce. Fried vegetables. Grains Breads made with saturated or trans fats, oils, or whole milk. Croissants. Sweet rolls. Donuts. High-fat crackers, such as cheese crackers and chips. Meats and other proteins Fatty meats, such as hot dogs, ribs, sausage, bacon, rib-eye roast or steak. High-fat deli meats, such as salami and bologna. Caviar. Domestic duck and goose. Organ meats, such as liver. Dairy Cream, sour cream, cream cheese, and creamed cottage cheese. Whole-milk cheeses. Whole or 2% milk (liquid, evaporated, or condensed). Whole buttermilk. Cream sauce or high-fat cheese sauce. Whole-milk yogurt. Fats and oils Meat fat, or shortening. Cocoa butter, hydrogenated oils, palm oil, coconut oil, palm kernel oil. Solid fats and shortenings, including bacon fat, salt pork, lard, and butter. Nondairy cream substitutes. Salad dressings with cheese or sour cream. Beverages Regular sodas and any drinks with added sugar. Sweets and desserts Frosting. Pudding. Cookies. Cakes. Pies. Milk chocolate or white chocolate. Buttered syrups. Full-fat ice cream or ice cream drinks. The items listed above may  not be a complete list of foods and beverages to avoid. Contact a dietitian for more information. Summary Heart-healthy meal planning includes limiting unhealthy fats, increasing healthy fats, limiting salt (sodium) intake and making other diet and lifestyle changes. Lose weight if you are overweight. Losing just  5-10% of your body weight can help your overall health and prevent diseases such as diabetes and heart disease. Focus on eating a balance of foods, including fruits and vegetables, low-fat or nonfat dairy, lean protein, nuts and legumes, whole grains, and heart-healthy oils and fats. This information is not intended to replace advice given to you by your health care provider. Make sure you discuss any questions you have with your health care provider. Document Revised: 07/17/2021 Document Reviewed: 07/17/2021 [employer]vier Patient Education  2024 [employer].

## 2023-11-29 ENCOUNTER — Encounter: Payer: Self-pay | Admitting: Nurse Practitioner

## 2023-11-29 ENCOUNTER — Ambulatory Visit (INDEPENDENT_AMBULATORY_CARE_PROVIDER_SITE_OTHER): Payer: Self-pay | Admitting: Nurse Practitioner

## 2023-11-29 VITALS — BP 131/83 | HR 66 | Temp 98.7°F | Ht 69.5 in | Wt 211.2 lb

## 2023-11-29 DIAGNOSIS — Z0000 Encounter for general adult medical examination without abnormal findings: Secondary | ICD-10-CM | POA: Diagnosis not present

## 2023-11-29 DIAGNOSIS — N400 Enlarged prostate without lower urinary tract symptoms: Secondary | ICD-10-CM | POA: Diagnosis not present

## 2023-11-29 DIAGNOSIS — Z833 Family history of diabetes mellitus: Secondary | ICD-10-CM

## 2023-11-29 DIAGNOSIS — G40909 Epilepsy, unspecified, not intractable, without status epilepticus: Secondary | ICD-10-CM

## 2023-11-29 DIAGNOSIS — I10 Essential (primary) hypertension: Secondary | ICD-10-CM | POA: Diagnosis not present

## 2023-11-29 DIAGNOSIS — E7800 Pure hypercholesterolemia, unspecified: Secondary | ICD-10-CM

## 2023-11-29 DIAGNOSIS — Z1211 Encounter for screening for malignant neoplasm of colon: Secondary | ICD-10-CM | POA: Diagnosis not present

## 2023-11-29 DIAGNOSIS — Z5181 Encounter for therapeutic drug level monitoring: Secondary | ICD-10-CM

## 2023-11-29 MED ORDER — PHENOBARBITAL 97.2 MG PO TABS: ORAL_TABLET | ORAL | 3 refills | Status: DC

## 2023-11-29 NOTE — Assessment & Plan Note (Signed)
 Chronic, stable.  BP stable today and lower at home.  Continue Lisinopril  20 MG daily.  Recommend he check BP at home at least three mornings a week.  LABS: CBC, CMP, TSH.  Focus on DASH diet at home. Could consider HCTZ in future as ENT believes he may have Meniere's and this may offer benefit to it.

## 2023-11-29 NOTE — Assessment & Plan Note (Signed)
Check Phenobarbital level 

## 2023-11-29 NOTE — Assessment & Plan Note (Signed)
 Continue diet focus at this time, ASCVD 7.2%, recheck lipid panel today == he is fasting.

## 2023-11-29 NOTE — Assessment & Plan Note (Addendum)
 Check A1c today per patient request for screening. Check annually.

## 2023-11-29 NOTE — Progress Notes (Signed)
 BP 131/83   Pulse 66   Temp 98.7 F (37.1 C) (Oral)   Ht 5' 9.5" (1.765 m)   Wt 211 lb 3.2 oz (95.8 kg)   SpO2 97%   BMI 30.74 kg/m    Subjective:    Patient ID: Dillon Blake, male    DOB: 10/31/1965, 58 y.o.   MRN: [id number]  HPI: SANAD FEARNOW is a 58 y.o. male presenting on 11/29/2023 for comprehensive medical examination. Current medical complaints include:none  He currently lives with: girlfriend Interim Problems from his last visit: no   Having pain to right knuckle, present on and off for two months but has gotten worse.  Is a 2/10 pain wise.  Notices it most with opening bottle and squeezing bottle to open.  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    HYPERTENSION Continues on Lisinopril  20 MG daily.  Doing cardio 3 days a week + lift weights.  Changes to diet. Hypertension status: controlled  Satisfied with current treatment? yes Duration of hypertension: chronic BP monitoring frequency:  3 times a week BP range: 129/80 yesterday BP medication side effects:  no Medication compliance: good compliance Aspirin: no Recurrent headaches: no Visual changes: no Palpitations: no Dyspnea: no Chest pain: no Lower extremity edema: no Dizzy/lightheaded: no  The 10-year ASCVD risk score (Arnett DK, et al., 2019) is: 7.2%   Values used to calculate the score:     Age: 53 years     Sex: Male     Is Non-Hispanic African American: No     Diabetic: No     Tobacco smoker: No     Systolic Blood Pressure: 131 mmHg     Is BP treated: Yes     HDL Cholesterol: 55 mg/dL     Total Cholesterol: 209 mg/dL   SEIZURE DISORDER: Last seizure was in April 1991, first one was June of 1987. Continues on 97.2 MG at bedtime, dose he has been on for many years since diagnosis. Has only had two seizures in his life. Saw neurology years ago and they did sleep testing, reports they found that when he had lack of sleep this led to seizures.  He stays on  adequate sleep regimen at home.  PDMP review last fill 08/08/23, no other controlled substances noted.  Functional Status Survey: Is the patient deaf or have difficulty hearing?: No Does the patient have difficulty seeing, even when wearing glasses/contacts?: No Does the patient have difficulty concentrating, remembering, or making decisions?: No Does the patient have difficulty walking or climbing stairs?: No Does the patient have difficulty dressing or bathing?: No Does the patient have difficulty doing errands alone such as visiting a doctor's office or shopping?: No  FALL RISK:    11/29/2023    3:12 PM 07/12/2023    3:17 PM 06/14/2023    9:28 AM 04/25/2023    4:22 PM 02/15/2023    4:02 PM  Fall Risk   Falls in the past year? 0 0 0 0 0  Number falls in past yr: 0 0 0 0 0  Injury with Fall? 0 0 0 0 0  Risk for fall due to : No Fall Risks No Fall Risks No Fall Risks No Fall Risks No Fall Risks  Follow up Falls evaluation completed Falls evaluation completed Falls evaluation completed Falls evaluation completed Falls evaluation completed    Depression Screen    11/29/2023    3:12 PM  07/12/2023    3:23 PM 06/14/2023    9:30 AM 04/25/2023    4:23 PM 02/15/2023    4:01 PM  Depression screen PHQ 2/9  Decreased Interest 0 0 0 0 0  Down, Depressed, Hopeless 0 0 0 0 0  PHQ - 2 Score 0 0 0 0 0  Altered sleeping 1 1 1  0 1  Tired, decreased energy 0 0 0 0 0  Change in appetite 0 0 0 0 0  Feeling bad or failure about yourself  0 0 0 0 0  Trouble concentrating 0 0 0 0 0  Moving slowly or fidgety/restless 0 0 0 0 0  Suicidal thoughts 0 0 0 0 0  PHQ-9 Score 1 1 1  0 1  Difficult doing work/chores Not difficult at all Not difficult at all Not difficult at all Not difficult at all Not difficult at all    Advanced Directives <no information>  Past Medical History:  Past Medical History:  Diagnosis Date   Allergy    Arthritis 2021   Neck? Thumb definitely   ED (erectile dysfunction)     GERD (gastroesophageal reflux disease)    Occasionally spicy food   Hypertension 2019   1 pill a day   Impotence, organic    Rib injury    Seizure disorder [facility])     Surgical History:  Past Surgical History:  Procedure Laterality Date   TONSILLECTOMY  1972    Medications:  Current Outpatient Medications on File Prior to Visit  Medication Sig   latanoprost (XALATAN) 0.005 % ophthalmic solution 1 drop at bedtime.   lisinopril  (ZESTRIL ) 20 MG tablet Take 1 tablet (20 mg total) by mouth daily.   Multiple Vitamin (MULTIVITAMIN) tablet Take 1 tablet by mouth daily.   tadalafil  (CIALIS ) 20 MG tablet TAKE 1 TABLET BY MOUTH EVERY DAY AS NEEDED   No current facility-administered medications on file prior to visit.    Allergies:  Allergies  Allergen Reactions   Keflex [Cephalexin]    Penicillin G Benzathine     Social History:  Social History   Socioeconomic History   Marital status: Single    Spouse name: Not on file   Number of children: Not on file   Years of education: Not on file   Highest education level: Associate degree: academic program  Occupational History   Not on file  Tobacco Use   Smoking status: Never   Smokeless tobacco: Never  Vaping Use   Vaping status: Never Used  Substance and Sexual Activity   Alcohol use: Yes    Alcohol/week: 3.0 - 4.0 standard drinks of alcohol    Types: 3 - 4 Cans of beer per week   Drug use: No   Sexual activity: Yes  Other Topics Concern   Not on file  Social History Narrative   Not on file   Social Drivers of Health   Financial Resource Strain: Low Risk  (11/29/2023)   Overall Financial Resource Strain (CARDIA)    Difficulty of Paying Living Expenses: Not hard at all  Food Insecurity: No Food Insecurity (11/29/2023)   Hunger Vital Sign    Worried About Running Out of Food in the Last Year: Never true    Ran Out of Food in the Last Year: Never true  Transportation Needs: No Transportation Needs (11/29/2023)   PRAPARE -  Administrator, Civil Service (Medical): No    Lack of Transportation (Non-Medical): No  Physical Activity: Insufficiently Active (11/29/2023)  Exercise Vital Sign    Days of Exercise per Week: 2 days    Minutes of Exercise per Session: 40 min  Stress: No Stress Concern Present (11/29/2023)   [employer] of Occupational Health - Occupational Stress Questionnaire    Feeling of Stress : Only a little  Social Connections: Moderately Integrated (11/29/2023)   Social Connection and Isolation Panel [NHANES]    Frequency of Communication with Friends and Family: Twice a week    Frequency of Social Gatherings with Friends and Family: Once a week    Attends Religious Services: 1 to 4 times per year    Active Member of [employer] or Organizations: No    Attends Banker Meetings: Never    Marital Status: Living with partner  Intimate Partner Violence: Not At Risk (11/29/2023)   Humiliation, Afraid, Rape, and Kick questionnaire    Fear of Current or Ex-Partner: No    Emotionally Abused: No    Physically Abused: No    Sexually Abused: No   Social History   Tobacco Use  Smoking Status Never  Smokeless Tobacco Never   Social History   Substance and Sexual Activity  Alcohol Use Yes   Alcohol/week: 3.0 - 4.0 standard drinks of alcohol   Types: 3 - 4 Cans of beer per week    Family History:  Family History  Problem Relation Age of Onset   Arthritis Mother    Lupus Mother    Seizures Mother    Allergies Mother    Bursitis Mother    Cancer Mother        leukemia   Diabetes Mother    Fibromyalgia Mother    Hypertension Father    Diabetes Brother    Diabetes Maternal Grandmother    Stroke Maternal Grandmother    Cancer Maternal Grandfather        bile duct   Heart disease Paternal Grandmother        MI   Cancer Paternal Grandfather        lung    Past medical history, surgical history, medications, allergies, family history and social history reviewed with  patient today and changes made to appropriate areas of the chart.   Review of Systems - negative All other ROS negative except what is listed above and in the HPI.      Objective:    BP 131/83   Pulse 66   Temp 98.7 F (37.1 C) (Oral)   Ht 5' 9.5" (1.765 m)   Wt 211 lb 3.2 oz (95.8 kg)   SpO2 97%   BMI 30.74 kg/m   Wt Readings from Last 3 Encounters:  11/29/23 211 lb 3.2 oz (95.8 kg)  10/25/23 207 lb 14.3 oz (94.3 kg)  07/12/23 208 lb (94.3 kg)    Physical Exam Vitals and nursing note reviewed.  Constitutional:      General: He is awake. He is not in acute distress.    Appearance: He is well-developed and well-groomed. He is not ill-appearing or toxic-appearing.  HENT:     Head: Normocephalic and atraumatic.     Right Ear: Hearing, tympanic membrane, ear canal and external ear normal. No drainage.     Left Ear: Hearing, tympanic membrane, ear canal and external ear normal. No drainage.     Nose: Nose normal.     Mouth/Throat:     Pharynx: Uvula midline.  Eyes:     General: Lids are normal.        Right eye:  No discharge.        Left eye: No discharge.     Extraocular Movements: Extraocular movements intact.     Conjunctiva/sclera: Conjunctivae normal.     Pupils: Pupils are equal, round, and reactive to light.     Visual Fields: Right eye visual fields normal and left eye visual fields normal.  Neck:     Thyroid: No thyromegaly.     Vascular: No carotid bruit or JVD.     Trachea: Trachea normal.  Cardiovascular:     Rate and Rhythm: Normal rate and regular rhythm.     Heart sounds: Normal heart sounds, S1 normal and S2 normal. No murmur heard.    No gallop.  Pulmonary:     Effort: Pulmonary effort is normal. No accessory muscle usage or respiratory distress.     Breath sounds: Normal breath sounds.  Abdominal:     General: Bowel sounds are normal.     Palpations: Abdomen is soft. There is no hepatomegaly or splenomegaly.     Tenderness: There is no abdominal  tenderness.  Musculoskeletal:        General: Normal range of motion.     Cervical back: Normal range of motion and neck supple.     Right lower leg: No edema.     Left lower leg: No edema.  Lymphadenopathy:     Head:     Right side of head: No submental, submandibular, tonsillar, preauricular or posterior auricular adenopathy.     Left side of head: No submental, submandibular, tonsillar, preauricular or posterior auricular adenopathy.     Cervical: No cervical adenopathy.  Skin:    General: Skin is warm and dry.     Capillary Refill: Capillary refill takes less than 2 seconds.     Findings: No rash.  Neurological:     Mental Status: He is alert and oriented to person, place, and time.     Gait: Gait is intact.     Deep Tendon Reflexes: Reflexes are normal and symmetric.     Reflex Scores:      Brachioradialis reflexes are 2+ on the right side and 2+ on the left side.      Patellar reflexes are 2+ on the right side and 2+ on the left side. Psychiatric:        Attention and Perception: Attention normal.        Mood and Affect: Mood normal.        Speech: Speech normal.        Behavior: Behavior normal. Behavior is cooperative.        Thought Content: Thought content normal.        Cognition and Memory: Cognition normal.    Results for orders placed or performed in visit on 06/14/23  Basic metabolic panel   Collection Time: 06/14/23  9:45 AM  Result Value Ref Range   Glucose 95 70 - 99 mg/dL   BUN 19 6 - 24 mg/dL   Creatinine, Ser 1.61 0.76 - 1.27 mg/dL   eGFR 85 >09 UE/AVW/0.98   BUN/Creatinine Ratio 18 9 - 20   Sodium 142 134 - 144 mmol/L   Potassium 4.7 3.5 - 5.2 mmol/L   Chloride 104 96 - 106 mmol/L   CO2 24 20 - 29 mmol/L   Calcium 8.7 8.7 - 10.2 mg/dL  Lipid Panel w/o Chol/HDL Ratio   Collection Time: 06/14/23  9:45 AM  Result Value Ref Range   Cholesterol, Total 209 (H) 100 - 199  mg/dL   Triglycerides 621 0 - 149 mg/dL   HDL 55 >30 mg/dL   VLDL Cholesterol Cal  18 5 - 40 mg/dL   LDL Chol Calc (NIH) 865 (H) 0 - 99 mg/dL      Assessment & Plan:   Problem List Items Addressed This Visit       Cardiovascular and Mediastinum   Hypertension   Chronic, stable.  BP stable today and lower at home.  Continue Lisinopril  20 MG daily.  Recommend he check BP at home at least three mornings a week.  LABS: CBC, CMP, TSH.  Focus on DASH diet at home. Could consider HCTZ in future as ENT believes he may have Meniere's and this may offer benefit to it.      Relevant Orders   CBC with Differential/Platelet   Comprehensive metabolic panel with GFR   TSH     Nervous and Auditory   Seizure disorder (HCC) - Primary   Chronic, stable with no seizure since 1991.  Continue current medication regimen and check phenobarbital  level today -- will continue 1 1/2 tablets as is consistent with taking.  Referral to neuro if any worsening seizures present.  Return in 6 months.      Relevant Medications   PHENobarbital  (LUMINAL) 97.2 MG tablet   Other Relevant Orders   Phenobarbital  level     Other   Medication monitoring encounter   Check Phenobarbital  level      Relevant Orders   Phenobarbital  level   Family history of diabetes mellitus in mother   Check A1c today per patient request for screening. Check annually.      Relevant Orders   HgB A1c   Elevated LDL cholesterol level   Continue diet focus at this time, ASCVD 7.2%, recheck lipid panel today == he is fasting.      Relevant Orders   Comprehensive metabolic panel with GFR   Lipid Panel w/o Chol/HDL Ratio   Other Visit Diagnoses       Benign prostatic hyperplasia without lower urinary tract symptoms       PSA on labs today.   Relevant Orders   PSA     Colon cancer screening       Cologuard ordered.   Relevant Orders   Cologuard     Encounter for annual physical exam       Annual physical today with labs and health maintenance reviewed, discussed with patient.        Discussed aspirin  prophylaxis for myocardial infarction prevention and decision was it was not indicated  LABORATORY TESTING:  Health maintenance labs ordered today as discussed above.   The natural history of prostate cancer and ongoing controversy regarding screening and potential treatment outcomes of prostate cancer has been discussed with the patient. The meaning of a false positive PSA and a false negative PSA has been discussed. He indicates understanding of the limitations of this screening test and wishes to proceed with screening PSA testing.   IMMUNIZATIONS:   - Tdap: Tetanus vaccination status reviewed: last tetanus booster within 10 years. - Influenza: Up to date - Pneumovax: Not applicable - Prevnar: Not applicable - Zostavax vaccine: may get this next time  SCREENING: - Colonoscopy:  Discussed with patient purpose of the colonoscopy is to detect colon cancer at curable precancerous or early stages   - AAA Screening: Not applicable  -Hearing Test: Not applicable  -Spirometry: Not applicable   PATIENT COUNSELING:    Sexuality: Discussed sexually transmitted  diseases, partner selection, use of condoms, avoidance of unintended pregnancy  and contraceptive alternatives.   Advised to avoid cigarette smoking.  I discussed with the patient that most people either abstain from alcohol or drink within safe limits (<=14/week and <=4 drinks/occasion for males, <=7/weeks and <= 3 drinks/occasion for females) and that the risk for alcohol disorders and other health effects rises proportionally with the number of drinks per week and how often a drinker exceeds daily limits.  Discussed cessation/primary prevention of drug use and availability of treatment for abuse.   Diet: Encouraged to adjust caloric intake to maintain  or achieve ideal body weight, to reduce intake of dietary saturated fat and total fat, to limit sodium intake by avoiding high sodium foods and not adding table salt, and to maintain  adequate dietary potassium and calcium preferably from fresh fruits, vegetables, and low-fat dairy products.    Stressed the importance of regular exercise  Injury prevention: Discussed safety belts, safety helmets, smoke detector, smoking near bedding or upholstery.   Dental health: Discussed importance of regular tooth brushing, flossing, and dental visits.   Follow up plan: NEXT PREVENTATIVE PHYSICAL DUE IN 1 YEAR. Return in about 6 months (around 05/30/2024) for HTN/HLD, SEIZURE DISORDER.

## 2023-11-29 NOTE — Assessment & Plan Note (Signed)
Chronic, stable with no seizure since 1991.  Continue current medication regimen and check phenobarbital level today -- will continue 1 1/2 tablets as is consistent with taking.  Referral to neuro if any worsening seizures present.  Return in 6 months.

## 2023-11-30 LAB — COMPREHENSIVE METABOLIC PANEL WITH GFR
ALT: 26 IU/L (ref 0–44)
AST: 28 IU/L (ref 0–40)
Albumin: 4.6 g/dL (ref 3.8–4.9)
Alkaline Phosphatase: 55 IU/L (ref 44–121)
BUN/Creatinine Ratio: 14 (ref 9–20)
BUN: 15 mg/dL (ref 6–24)
Bilirubin Total: 0.5 mg/dL (ref 0.0–1.2)
CO2: 23 mmol/L (ref 20–29)
Calcium: 9.5 mg/dL (ref 8.7–10.2)
Chloride: 99 mmol/L (ref 96–106)
Creatinine, Ser: 1.06 mg/dL (ref 0.76–1.27)
Globulin, Total: 2.4 g/dL (ref 1.5–4.5)
Glucose: 71 mg/dL (ref 70–99)
Potassium: 4.5 mmol/L (ref 3.5–5.2)
Sodium: 138 mmol/L (ref 134–144)
Total Protein: 7 g/dL (ref 6.0–8.5)
eGFR: 82 mL/min/{1.73_m2} (ref >59)

## 2023-11-30 LAB — CBC WITH DIFFERENTIAL/PLATELET
Basophils Absolute: 0.1 10*3/uL (ref 0.0–0.2)
Basos: 1 %
EOS (ABSOLUTE): 0.2 10*3/uL (ref 0.0–0.4)
Eos: 3 %
Hematocrit: 50.8 % (ref 37.5–51.0)
Hemoglobin: 16.6 g/dL (ref 13.0–17.7)
Immature Grans (Abs): 0 10*3/uL (ref 0.0–0.1)
Immature Granulocytes: 0 %
Lymphocytes Absolute: 2.5 10*3/uL (ref 0.7–3.1)
Lymphs: 32 %
MCH: 31.6 pg (ref 26.6–33.0)
MCHC: 32.7 g/dL (ref 31.5–35.7)
MCV: 97 fL (ref 79–97)
Monocytes Absolute: 0.6 10*3/uL (ref 0.1–0.9)
Monocytes: 8 %
Neutrophils Absolute: 4.4 10*3/uL (ref 1.4–7.0)
Neutrophils: 56 %
Platelets: 273 10*3/uL (ref 150–450)
RBC: 5.26 x10E6/uL (ref 4.14–5.80)
RDW: 12.2 % (ref 11.6–15.4)
WBC: 7.8 10*3/uL (ref 3.4–10.8)

## 2023-11-30 LAB — LIPID PANEL W/O CHOL/HDL RATIO
Cholesterol, Total: 201 mg/dL — ABNORMAL HIGH (ref 100–199)
HDL: 36 mg/dL — ABNORMAL LOW (ref >39)
LDL Chol Calc (NIH): 132 mg/dL — ABNORMAL HIGH (ref 0–99)
Triglycerides: 186 mg/dL — ABNORMAL HIGH (ref 0–149)
VLDL Cholesterol Cal: 33 mg/dL (ref 5–40)

## 2023-11-30 LAB — HEMOGLOBIN A1C
Est. average glucose Bld gHb Est-mCnc: 105 mg/dL
Hgb A1c MFr Bld: 5.3 % (ref 4.8–5.6)

## 2023-11-30 LAB — TSH: TSH: 1.37 u[IU]/mL (ref 0.450–4.500)

## 2023-11-30 LAB — PHENOBARBITAL LEVEL: Phenobarbital, Serum: 13 ug/mL — ABNORMAL LOW (ref 15–40)

## 2023-11-30 LAB — PSA: Prostate Specific Ag, Serum: 1.5 ng/mL (ref 0.0–4.0)

## 2023-12-01 ENCOUNTER — Ambulatory Visit: Payer: Self-pay | Admitting: Nurse Practitioner

## 2024-03-31 ENCOUNTER — Other Ambulatory Visit: Payer: Self-pay | Admitting: Nurse Practitioner

## 2024-04-02 NOTE — Telephone Encounter (Signed)
 Requested Prescriptions  Pending Prescriptions Disp Refills   tadalafil  (CIALIS ) 20 MG tablet [Pharmacy Med Name: TADALAFIL  20 MG TABLET] 4 tablet 22    Sig: TAKE 1 TABLET BY MOUTH EVERY DAY AS NEEDED     Urology: Erectile Dysfunction Agents Passed - 04/02/2024  1:16 PM      Passed - AST in normal range and within 360 days    AST  Date Value Ref Range Status  11/29/2023 28 0 - 40 IU/L Final         Passed - ALT in normal range and within 360 days    ALT  Date Value Ref Range Status  11/29/2023 26 0 - 44 IU/L Final         Passed - Last BP in normal range    BP Readings from Last 1 Encounters:  11/29/23 131/83         Passed - Valid encounter within last 12 months    Recent Outpatient Visits           4 months ago Seizure disorder [facility])   [facility] [place] [facility] Cornelius, Melanie DASEN, NP

## 2024-05-24 NOTE — Patient Instructions (Incomplete)
 Be Involved in Caring For Your Health:  Taking Medications When medications are taken as directed, they can greatly improve your health. But if they are not taken as prescribed, they may not work. In some cases, not taking them correctly can be harmful. To help ensure your treatment remains effective and safe, understand your medications and how to take them. Bring your medications to each visit for review by your provider.  Your lab results, notes, and after visit summary will be available on My Chart. We strongly encourage you to use this feature. If lab results are abnormal the clinic will contact you with the appropriate steps. If the clinic does not contact you assume the results are satisfactory. You can always view your results on My Chart. If you have questions regarding your health or results, please contact the clinic during office hours. You can also ask questions on My Chart.  We at [facility] are grateful that you chose us  to provide your care. We strive to provide evidence-based and compassionate care and are always looking for feedback. If you get a survey from the clinic please complete this so we can hear your opinions.  Healthy Eating, Adult Healthy eating may help you get and keep a healthy body weight, reduce the risk of chronic disease, and live a long and productive life. It is important to follow a healthy eating pattern. Your nutritional and calorie needs should be met mainly by different nutrient-rich foods. What are tips for following this plan? Reading food labels Read labels and choose the following: Reduced or low sodium products. Juices with 100% fruit juice. Foods with low saturated fats (<3 g per serving) and high polyunsaturated and monounsaturated fats. Foods with whole grains, such as whole wheat, cracked wheat, brown rice, and wild rice. Whole grains that are fortified with folic acid. This is recommended for females who are pregnant or who want to  become pregnant. Read labels and do not eat or drink the following: Foods or drinks with added sugars. These include foods that contain brown sugar, corn sweetener, corn syrup, dextrose , fructose, glucose, high-fructose corn syrup, honey, invert sugar, lactose, malt syrup, maltose, molasses, raw sugar, sucrose, trehalose, or turbinado sugar. Limit your intake of added sugars to less than 10% of your total daily calories. Do not eat more than the following amounts of added sugar per day: 6 teaspoons (25 g) for females. 9 teaspoons (38 g) for males. Foods that contain processed or refined starches and grains. Refined grain products, such as white flour, degermed cornmeal, white bread, and white rice. Shopping Choose nutrient-rich snacks, such as vegetables, whole fruits, and nuts. Avoid high-calorie and high-sugar snacks, such as potato chips, fruit snacks, and candy. Use oil-based dressings and spreads on foods instead of solid fats such as butter, margarine, sour cream, or cream cheese. Limit pre-made sauces, mixes, and instant products such as flavored rice, instant noodles, and ready-made pasta. Try more plant-protein sources, such as tofu, tempeh, black beans, edamame, lentils, nuts, and seeds. Explore eating plans such as the Mediterranean diet or vegetarian diet. Try heart-healthy dips made with beans and healthy fats like hummus and guacamole. Vegetables go great with these. Cooking Use oil to saut or stir-fry foods instead of solid fats such as butter, margarine, or lard. Try baking, boiling, grilling, or broiling instead of frying. Remove the fatty part of meats before cooking. Steam vegetables in water  or broth. Meal planning  At meals, imagine dividing your plate into fourths: One-half of  your plate is fruits and vegetables. One-fourth of your plate is whole grains. One-fourth of your plate is protein, especially lean meats, poultry, eggs, tofu, beans, or nuts. Include low-fat  dairy as part of your daily diet. Lifestyle Choose healthy options in all settings, including home, work, school, restaurants, or stores. Prepare your food safely: Wash your hands after handling raw meats. Where you prepare food, keep surfaces clean by regularly washing with hot, soapy water . Keep raw meats separate from ready-to-eat foods, such as fruits and vegetables. Cook seafood, meat, poultry, and eggs to the recommended temperature. Get a food thermometer. Store foods at safe temperatures. In general: Keep cold foods at 84F (4.4C) or below. Keep hot foods at 184F (60C) or above. Keep your freezer at [facility] (-17.8C) or below. Foods are not safe to eat if they have been between the temperatures of 40-184F (4.4-60C) for more than 2 hours. What foods should I eat? Fruits Aim to eat 1-2 cups of fresh, canned (in natural juice), or frozen fruits each day. One cup of fruit equals 1 small apple, 1 large banana, 8 large strawberries, 1 cup (237 g) canned fruit,  cup (82 g) dried fruit, or 1 cup (240 mL) 100% juice. Vegetables Aim to eat 2-4 cups of fresh and frozen vegetables each day, including different varieties and colors. One cup of vegetables equals 1 cup (91 g) broccoli or cauliflower florets, 2 medium carrots, 2 cups (150 g) raw, leafy greens, 1 large tomato, 1 large bell pepper, 1 large sweet potato, or 1 medium white potato. Grains Aim to eat 5-10 ounce-equivalents of whole grains each day. Examples of 1 ounce-equivalent of grains include 1 slice of bread, 1 cup (40 g) ready-to-eat cereal, 3 cups (24 g) popcorn, or  cup (93 g) cooked rice. Meats and other proteins Try to eat 5-7 ounce-equivalents of protein each day. Examples of 1 ounce-equivalent of protein include 1 egg,  oz nuts (12 almonds, 24 pistachios, or 7 walnut halves), 1/4 cup (90 g) cooked beans, 6 tablespoons (90 g) hummus or 1 tablespoon (16 g) peanut butter. A cut of meat or fish that is the size of a deck of  cards is about 3-4 ounce-equivalents (85 g). Of the protein you eat each week, try to have at least 8 sounce (227 g) of seafood. This is about 2 servings per week. This includes salmon, trout, herring, sardines, and anchovies. Dairy Aim to eat 3 cup-equivalents of fat-free or low-fat dairy each day. Examples of 1 cup-equivalent of dairy include 1 cup (240 mL) milk, 8 ounces (250 g) yogurt, 1 ounces (44 g) natural cheese, or 1 cup (240 mL) fortified soy milk. Fats and oils Aim for about 5 teaspoons (21 g) of fats and oils per day. Choose monounsaturated fats, such as canola and olive oils, mayonnaise made with olive oil or avocado oil, avocados, peanut butter, and most nuts, or polyunsaturated fats, such as sunflower, corn, and soybean oils, walnuts, pine nuts, sesame seeds, sunflower seeds, and flaxseed. Beverages Aim for 6 eight-ounce glasses of water  per day. Limit coffee to 3-5 eight-ounce cups per day. Limit caffeinated beverages that have added calories, such as soda and energy drinks. If you drink alcohol: Limit how much you have to: 0-1 drink a day if you are male. 0-2 drinks a day if you are male. Know how much alcohol is in your drink. In the [place] one drink is one 12 oz bottle of beer (355 mL), one 5 oz glass of wine (  148 mL), or one 1 oz glass of hard liquor (44 mL). Seasoning and other foods Try not to add too much salt to your food. Try using herbs and spices instead of salt. Try not to add sugar to food. This information is based on [place] nutrition guidelines. To learn more, visit [employer]. Exact amounts may vary. You may need different amounts. This information is not intended to replace advice given to you by your health care provider. Make sure you discuss any questions you have with your health care provider. Document Revised: 03/12/2022 Document Reviewed: 03/12/2022 Elsevier Patient Education  2024 [employer].

## 2024-06-05 ENCOUNTER — Ambulatory Visit: Admitting: Nurse Practitioner

## 2024-06-05 VITALS — BP 126/81 | HR 76 | Temp 98.7°F | Ht 69.5 in | Wt 216.4 lb

## 2024-06-05 DIAGNOSIS — E7800 Pure hypercholesterolemia, unspecified: Secondary | ICD-10-CM | POA: Diagnosis not present

## 2024-06-05 DIAGNOSIS — E66811 Obesity, class 1: Secondary | ICD-10-CM | POA: Diagnosis not present

## 2024-06-05 DIAGNOSIS — E6609 Other obesity due to excess calories: Secondary | ICD-10-CM

## 2024-06-05 DIAGNOSIS — M7711 Lateral epicondylitis, right elbow: Secondary | ICD-10-CM | POA: Insufficient documentation

## 2024-06-05 DIAGNOSIS — I10 Essential (primary) hypertension: Secondary | ICD-10-CM

## 2024-06-05 DIAGNOSIS — G40909 Epilepsy, unspecified, not intractable, without status epilepticus: Secondary | ICD-10-CM | POA: Diagnosis not present

## 2024-06-05 DIAGNOSIS — Z6831 Body mass index (BMI) 31.0-31.9, adult: Secondary | ICD-10-CM | POA: Diagnosis not present

## 2024-06-05 DIAGNOSIS — E669 Obesity, unspecified: Secondary | ICD-10-CM | POA: Insufficient documentation

## 2024-06-05 MED ORDER — PHENOBARBITAL 97.2 MG PO TABS: ORAL_TABLET | ORAL | 3 refills | Status: AC

## 2024-06-05 NOTE — Assessment & Plan Note (Signed)
 Acute for 10 weeks. Will place referral to ortho for further recommendations.

## 2024-06-05 NOTE — Assessment & Plan Note (Signed)
 Chronic, stable.  BP stable today.  Continue Lisinopril  20 MG daily.  Recommend he check BP at home at least three mornings a week.  LABS: CMP.  Focus on DASH diet at home. Could consider HCTZ in future as ENT believes he may have Meniere's and this may offer benefit to it.

## 2024-06-05 NOTE — Assessment & Plan Note (Signed)
 Continue diet focus at this time, has not ate since 0900. Will check levels today. He would benefit from statin therapy, but wishes to hold off at this time. Discussed CT Calcium Scoring, which he will think about. The 10-year ASCVD risk score (Arnett DK, et al., 2019) is: 9.2%   Values used to calculate the score:     Age: 58 years     Clinically relevant sex: Male     Is Non-Hispanic African American: No     Diabetic: No     Tobacco smoker: No     Systolic Blood Pressure: 126 mmHg     Is BP treated: Yes     HDL Cholesterol: 36 mg/dL     Total Cholesterol: 201 mg/dL

## 2024-06-05 NOTE — Assessment & Plan Note (Addendum)
 Chronic, stable with no seizure since 1991.  Continue current medication regimen and check phenobarbital  level at physical -- will continue 1 1/2 tablets as is consistent with taking.  Referral to neuro if any worsening seizures present.  Return in 6 months.

## 2024-06-05 NOTE — Assessment & Plan Note (Signed)
 BMI 31.50. Recommended eating smaller high protein, low fat meals more frequently and exercising 30 mins a day 5 times a week with a goal of 10-15lb weight loss in the next 3 months. Patient voiced their understanding and motivation to adhere to these recommendations. We discussed GLP1s today, risks and benefits + side effects. He will look into online programs for these.

## 2024-06-05 NOTE — Progress Notes (Signed)
 BP 126/81 (BP Location: Left Arm, Cuff Size: Normal)   Pulse 76   Temp 98.7 F (37.1 C) (Oral)   Ht 5' 9.5 (1.765 m)   Wt 216 lb 6.4 oz (98.2 kg)   SpO2 95%   BMI 31.50 kg/m    Subjective:    Patient ID: Dillon Blake, male    DOB: 10-19-1965, 58 y.o.   MRN: [id number]  HPI: Dillon Blake is a 58 y.o. male  Chief Complaint  Patient presents with   Hyperlipidemia   Hypertension   Seizures   Currently has tennis elbow which has been present for 10 weeks to right elbow.    HYPERTENSION / HYPERLIPIDEMIA  Continues on Lisinopril  20 MG daily, no statin. Concerned about weight gain. Satisfied with current treatment? yes Duration of hypertension: chronic BP monitoring frequency: 2-3 times a week BP range: 117/80 lowest, on average <130/80 BP medication side effects: no Duration of hyperlipidemia: chronic Cholesterol supplements: none Aspirin: no Recent stressors: no Recurrent headaches: no Visual changes: no Palpitations: no Dyspnea: no Chest pain: no Lower extremity edema: no Dizzy/lightheaded: no The 10-year ASCVD risk score (Arnett DK, et al., 2019) is: 9.2%   Values used to calculate the score:     Age: 13 years     Clinically relevant sex: Male     Is Non-Hispanic African American: No     Diabetic: No     Tobacco smoker: No     Systolic Blood Pressure: 126 mmHg     Is BP treated: Yes     HDL Cholesterol: 36 mg/dL     Total Cholesterol: 201 mg/dL  SEIZURE DISORDER: Last seizure was in April 1991, first one was June of 1987. Continues on Phenobarbital   97.2 MG at bedtime, dose he has been on for many years since diagnosis. Has only had two seizures in his life. Saw neurology years ago and they did sleep testing, reports they found that when he had lack of sleep this led to seizures.  Continues focus on adequate sleep regimen at home.  PDMP review last fill 05/25/24, no other controlled substances noted.  Relevant past medical, surgical, family and social  history reviewed and updated as indicated. Interim medical history since our last visit reviewed. Allergies and medications reviewed and updated.  Review of Systems  Constitutional:  Negative for activity change, diaphoresis, fatigue and fever.  Respiratory:  Negative for cough, chest tightness, shortness of breath and wheezing.   Cardiovascular:  Negative for chest pain, palpitations and leg swelling.  Gastrointestinal:  Negative for abdominal distention, abdominal pain, constipation, diarrhea, nausea and vomiting.  Endocrine: Negative for cold intolerance, heat intolerance, polydipsia, polyphagia and polyuria.  Musculoskeletal: Negative.   Skin: Negative.   Neurological:  Negative for dizziness, syncope, weakness, light-headedness, numbness and headaches.  Psychiatric/Behavioral: Negative.      Per HPI unless specifically indicated above     Objective:    BP 126/81 (BP Location: Left Arm, Cuff Size: Normal)   Pulse 76   Temp 98.7 F (37.1 C) (Oral)   Ht 5' 9.5 (1.765 m)   Wt 216 lb 6.4 oz (98.2 kg)   SpO2 95%   BMI 31.50 kg/m   Wt Readings from Last 3 Encounters:  06/05/24 216 lb 6.4 oz (98.2 kg)  11/29/23 211 lb 3.2 oz (95.8 kg)  10/25/23 207 lb 14.3 oz (94.3 kg)    Physical Exam Vitals and nursing note reviewed.  Constitutional:      General: He is  awake. He is not in acute distress.    Appearance: He is well-developed and well-groomed. He is obese. He is not ill-appearing or toxic-appearing.  HENT:     Head: Normocephalic.     Right Ear: Hearing and external ear normal.     Left Ear: Hearing and external ear normal.  Eyes:     General: Lids are normal.     Extraocular Movements: Extraocular movements intact.     Conjunctiva/sclera: Conjunctivae normal.  Neck:     Thyroid: No thyromegaly.     Vascular: No carotid bruit.  Cardiovascular:     Rate and Rhythm: Normal rate and regular rhythm.     Heart sounds: Normal heart sounds. No murmur heard.    No gallop.   Pulmonary:     Effort: No accessory muscle usage or respiratory distress.     Breath sounds: Normal breath sounds.  Abdominal:     General: Bowel sounds are normal. There is no distension.     Palpations: Abdomen is soft.     Tenderness: There is no abdominal tenderness.  Musculoskeletal:     Cervical back: Full passive range of motion without pain.     Right lower leg: No edema.     Left lower leg: No edema.  Lymphadenopathy:     Cervical: No cervical adenopathy.  Skin:    General: Skin is warm.     Capillary Refill: Capillary refill takes less than 2 seconds.  Neurological:     Mental Status: He is alert and oriented to person, place, and time.     Deep Tendon Reflexes: Reflexes are normal and symmetric.     Reflex Scores:      Brachioradialis reflexes are 2+ on the right side and 2+ on the left side.      Patellar reflexes are 2+ on the right side and 2+ on the left side. Psychiatric:        Attention and Perception: Attention normal.        Mood and Affect: Mood normal.        Speech: Speech normal.        Behavior: Behavior normal. Behavior is cooperative.        Thought Content: Thought content normal.    Results for orders placed or performed in visit on 11/29/23  CBC with Differential/Platelet   Collection Time: 11/29/23  3:39 PM  Result Value Ref Range   WBC 7.8 3.4 - 10.8 x10E3/uL   RBC 5.26 4.14 - 5.80 x10E6/uL   Hemoglobin 16.6 13.0 - 17.7 g/dL   Hematocrit 49.1 62.4 - 51.0 %   MCV 97 79 - 97 fL   MCH 31.6 26.6 - 33.0 pg   MCHC 32.7 31.5 - 35.7 g/dL   RDW 87.7 88.3 - 84.5 %   Platelets 273 150 - 450 x10E3/uL   Neutrophils 56 Not Estab. %   Lymphs 32 Not Estab. %   Monocytes 8 Not Estab. %   Eos 3 Not Estab. %   Basos 1 Not Estab. %   Neutrophils Absolute 4.4 1.4 - 7.0 x10E3/uL   Lymphocytes Absolute 2.5 0.7 - 3.1 x10E3/uL   Monocytes Absolute 0.6 0.1 - 0.9 x10E3/uL   EOS (ABSOLUTE) 0.2 0.0 - 0.4 x10E3/uL   Basophils Absolute 0.1 0.0 - 0.2 x10E3/uL    Immature Granulocytes 0 Not Estab. %   Immature Grans (Abs) 0.0 0.0 - 0.1 x10E3/uL  Comprehensive metabolic panel with GFR   Collection Time: 11/29/23  3:39 PM  Result Value Ref Range   Glucose 71 70 - 99 mg/dL   BUN 15 6 - 24 mg/dL   Creatinine, Ser 8.93 0.76 - 1.27 mg/dL   eGFR 82 >40 fO/fpw/8.26   BUN/Creatinine Ratio 14 9 - 20   Sodium 138 134 - 144 mmol/L   Potassium 4.5 3.5 - 5.2 mmol/L   Chloride 99 96 - 106 mmol/L   CO2 23 20 - 29 mmol/L   Calcium 9.5 8.7 - 10.2 mg/dL   Total Protein 7.0 6.0 - 8.5 g/dL   Albumin 4.6 3.8 - 4.9 g/dL   Globulin, Total 2.4 1.5 - 4.5 g/dL   Bilirubin Total 0.5 0.0 - 1.2 mg/dL   Alkaline Phosphatase 55 44 - 121 IU/L   AST 28 0 - 40 IU/L   ALT 26 0 - 44 IU/L  Lipid Panel w/o Chol/HDL Ratio   Collection Time: 11/29/23  3:39 PM  Result Value Ref Range   Cholesterol, Total 201 (H) 100 - 199 mg/dL   Triglycerides 813 (H) 0 - 149 mg/dL   HDL 36 (L) >60 mg/dL   VLDL Cholesterol Cal 33 5 - 40 mg/dL   LDL Chol Calc (NIH) 867 (H) 0 - 99 mg/dL  TSH   Collection Time: 11/29/23  3:39 PM  Result Value Ref Range   TSH 1.370 0.450 - 4.500 uIU/mL  PSA   Collection Time: 11/29/23  3:39 PM  Result Value Ref Range   Prostate Specific Ag, Serum 1.5 0.0 - 4.0 ng/mL  Phenobarbital  level   Collection Time: 11/29/23  3:39 PM  Result Value Ref Range   Phenobarbital , Serum 13 (L) 15 - 40 ug/mL  HgB A1c   Collection Time: 11/29/23  3:39 PM  Result Value Ref Range   Hgb A1c MFr Bld 5.3 4.8 - 5.6 %   Est. average glucose Bld gHb Est-mCnc 105 mg/dL      Assessment & Plan:   Problem List Items Addressed This Visit       Cardiovascular and Mediastinum   Hypertension   Chronic, stable.  BP stable today.  Continue Lisinopril  20 MG daily.  Recommend he check BP at home at least three mornings a week.  LABS: CMP.  Focus on DASH diet at home. Could consider HCTZ in future as ENT believes he may have Meniere's and this may offer benefit to it.      Relevant  Orders   Comprehensive metabolic panel with GFR     Nervous and Auditory   Seizure disorder (HCC) - Primary   Chronic, stable with no seizure since 1991.  Continue current medication regimen and check phenobarbital  level at physical -- will continue 1 1/2 tablets as is consistent with taking.  Referral to neuro if any worsening seizures present.  Return in 6 months.      Relevant Medications   PHENobarbital  (LUMINAL) 97.2 MG tablet (Start on 06/24/2024)   Other Relevant Orders   Comprehensive metabolic panel with GFR     Musculoskeletal and Integument   Right tennis elbow   Acute for 10 weeks. Will place referral to ortho for further recommendations.      Relevant Orders   Ambulatory referral to Orthopedic Surgery     Other   Obesity   BMI 31.50. Recommended eating smaller high protein, low fat meals more frequently and exercising 30 mins a day 5 times a week with a goal of 10-15lb weight loss in the next 3 months. Patient voiced their understanding and motivation to  adhere to these recommendations. We discussed GLP1s today, risks and benefits + side effects. He will look into online programs for these.      Elevated LDL cholesterol level   Continue diet focus at this time, has not ate since 0900. Will check levels today. He would benefit from statin therapy, but wishes to hold off at this time. Discussed CT Calcium Scoring, which he will think about. The 10-year ASCVD risk score (Arnett DK, et al., 2019) is: 9.2%   Values used to calculate the score:     Age: 18 years     Clinically relevant sex: Male     Is Non-Hispanic African American: No     Diabetic: No     Tobacco smoker: No     Systolic Blood Pressure: 126 mmHg     Is BP treated: Yes     HDL Cholesterol: 36 mg/dL     Total Cholesterol: 201 mg/dL       Relevant Orders   Comprehensive metabolic panel with GFR   Lipid Panel w/o Chol/HDL Ratio     Follow up plan: Return in about 6 months (around 12/04/2024) for  Annual Physical after 11/28/24.

## 2024-06-06 ENCOUNTER — Ambulatory Visit: Payer: Self-pay | Admitting: Nurse Practitioner

## 2024-06-06 LAB — COMPREHENSIVE METABOLIC PANEL WITH GFR
ALT: 36 IU/L (ref 0–44)
AST: 33 IU/L (ref 0–40)
Albumin: 4.4 g/dL (ref 3.8–4.9)
Alkaline Phosphatase: 85 IU/L (ref 47–123)
BUN/Creatinine Ratio: 13 (ref 9–20)
BUN: 13 mg/dL (ref 6–24)
Bilirubin Total: 0.3 mg/dL (ref 0.0–1.2)
CO2: 25 mmol/L (ref 20–29)
Calcium: 9.4 mg/dL (ref 8.7–10.2)
Chloride: 98 mmol/L (ref 96–106)
Creatinine, Ser: 1.04 mg/dL (ref 0.76–1.27)
Globulin, Total: 2.9 g/dL (ref 1.5–4.5)
Glucose: 79 mg/dL (ref 70–99)
Potassium: 4.3 mmol/L (ref 3.5–5.2)
Sodium: 135 mmol/L (ref 134–144)
Total Protein: 7.3 g/dL (ref 6.0–8.5)
eGFR: 84 mL/min/1.73 (ref >59)

## 2024-06-06 LAB — LIPID PANEL W/O CHOL/HDL RATIO
Cholesterol, Total: 254 mg/dL — ABNORMAL HIGH (ref 100–199)
HDL: 59 mg/dL (ref >39)
LDL Chol Calc (NIH): 134 mg/dL — ABNORMAL HIGH (ref 0–99)
Triglycerides: 339 mg/dL — ABNORMAL HIGH (ref 0–149)
VLDL Cholesterol Cal: 61 mg/dL — ABNORMAL HIGH (ref 5–40)

## 2024-06-06 NOTE — Progress Notes (Signed)
 Contacted via MyChart The 10-year ASCVD risk score (Arnett DK, et al., 2019) is: 7.9%   Values used to calculate the score:     Age: 58 years     Clinically relevant sex: Male     Is Non-Hispanic African American: No     Diabetic: No     Tobacco smoker: No     Systolic Blood Pressure: 126 mmHg     Is BP treated: Yes     HDL Cholesterol: 59 mg/dL     Total Cholesterol: 254 mg/dL  Good morning Dillon Blake, your labs have returned: - Kidney function, creatinine and eGFR, remains normal, as is liver function, AST and ALT.  - Your cholesterol is still high. Your LDL is above normal.  The LDL is the bad cholesterol.  Over time and in combination with inflammation and other factors, this contributes to plaque which in turn may lead to stroke and/or heart attack down the road.  Sometimes high LDL is primarily genetic, and people might be eating all the right foods but still have high numbers.  Other times, there is room for improvement in one's diet and eating healthier can bring this number down and potentially reduce one's risk of heart attack and/or stroke. To reduce your LDL, Remember - more fruits and vegetables, more fish, and limit red meat and dairy products.  More soy, nuts, beans, barley, lentils, oats and plant sterol ester enriched margarine instead of butter.  I also encourage eliminating sugar and processed food.  Remember, shop on the outside of the grocery store and visit your [employer]. Anyone at or over 7.5% on ASCVD risk score, I offer a statin. Your level is 7.9%.   The task force found that in adults at increased risk but with no history of CVD events, statin therapy significantly reduced the risk of clinical outcomes such as stroke or heart attack by 22% to 33%, while also significantly reducing the risks of all-cause mortality and composite cardiovascular outcomes. Statins both lower cholesterol, which is helpful in reducing the risk of stroke and heart attack. Even outside of  lowering cholesterol, statins seem to protect heart health. Other important parts of heart health are healthy diet and regular exercise.  Would you like to try a low dose statin? Let me know. Also think about the CT Calcium scoring we discussed yesterday. Any questions? Keep being amazing!!  Thank you for allowing me to participate in your care.  I appreciate you. Kindest regards, Dillon Blake

## 2024-06-10 DIAGNOSIS — M7711 Lateral epicondylitis, right elbow: Secondary | ICD-10-CM | POA: Diagnosis not present

## 2024-06-16 NOTE — Patient Instructions (Incomplete)
 Be Involved in Caring For Your Health:  Taking Medications When medications are taken as directed, they can greatly improve your health. But if they are not taken as prescribed, they may not work. In some cases, not taking them correctly can be harmful. To help ensure your treatment remains effective and safe, understand your medications and how to take them. Bring your medications to each visit for review by your provider.  Your lab results, notes, and after visit summary will be available on My Chart. We strongly encourage you to use this feature. If lab results are abnormal the clinic will contact you with the appropriate steps. If the clinic does not contact you assume the results are satisfactory. You can always view your results on My Chart. If you have questions regarding your health or results, please contact the clinic during office hours. You can also ask questions on My Chart.  We at [facility] are grateful that you chose us  to provide your care. We strive to provide evidence-based and compassionate care and are always looking for feedback. If you get a survey from the clinic please complete this so we can hear your opinions.   Skin Abscess  A skin abscess is an infected spot of skin. It can have pus in it. An abscess can happen in any part of your body. Some abscesses break open (rupture) on their own. Most keep getting worse unless they are treated. If your abscess is not treated, the infection can spread deeper into your body and blood. This can make you feel sick. What are the causes? Germs that enter your skin. This may happen if you have: A cut or scrape. A wound from a needle or an insect bite. Blocked oil or sweat glands. A problem with the spot where your hair goes into your skin. A fluid-filled sac called a cyst under your skin. What increases the risk? Having problems with how your blood moves through your body. Having a weak body defense system (immune  system). Having diabetes. Having dry and irritated skin. Needing to get shots often. Putting drugs into your body with a needle. Having a splinter or something else in your skin. Smoking. What are the signs or symptoms? A firm bump under your skin that hurts. A bump with pus at the top. Redness and swelling. Warm or tender spots. A sore on the skin. How is this treated? You may need to: Put a heat pack or a warm, wet washcloth on the spot. Have the pus drained. Take antibiotics. Follow these instructions at home: Medicines Take over-the-counter and prescription medicines only as told by your doctor. If you were prescribed antibiotics, take them as told by your doctor. Do not stop taking them even if you start to feel better. Abscess care  If you have an abscess that has not drained, put heat on it. Use the heat source that your doctor recommends, such as a moist heat pack or a heating pad. Place a towel between your skin and the heat source. Leave the heat on for 20-30 minutes. If your skin turns bright red, take off the heat right away to prevent burns. The risk of burns is higher if you cannot feel pain, heat, or cold. Follow instructions from your doctor about how to take care of your abscess. Make sure you: Cover the abscess with a bandage. Wash your hands with soap and water for at least 20 seconds before and after you change your bandage. If you cannot  use soap and water, use hand sanitizer. Change your bandage as told by your doctor. Check your abscess every day for signs that the infection is getting worse. Check for: More redness, swelling, or pain. More fluid or blood. Warmth. More pus or a worse smell. General instructions To keep the infection from spreading: Do not share personal items or towels. Do not go in a hot tub with others. Avoid making skin contact with others. Be careful when you get rid of used bandages or any pus from the abscess. Do not smoke or  use any products that contain nicotine or tobacco. If you need help quitting, ask your doctor. Contact a doctor if: You see red streaks on your skin near the abscess. You have any signs of worse infection. You vomit every time you eat or drink. You have a fever, chills, or muscle aches. The cyst or abscess comes back. Get help right away if: You have very bad pain. You make less pee (urine) than normal. This information is not intended to replace advice given to you by your health care provider. Make sure you discuss any questions you have with your health care provider. Document Revised: 01/24/2022 Document Reviewed: 01/24/2022 [employer] Patient Education  2024 [employer].

## 2024-06-17 ENCOUNTER — Ambulatory Visit: Admitting: Nurse Practitioner

## 2024-12-04 ENCOUNTER — Encounter: Admitting: Nurse Practitioner
